# Patient Record
Sex: Male | Born: 1955 | Race: White | Hispanic: No | Marital: Married | Smoking: Never smoker
Health system: Southern US, Community
[De-identification: ages and names within clinical notes are randomized; demographics above are authoritative.]

## PROBLEM LIST (undated history)

## (undated) ENCOUNTER — Emergency Department (HOSPITAL_COMMUNITY): Admission: EM | Payer: BLUE CROSS/BLUE SHIELD | Source: Home / Self Care

## (undated) DIAGNOSIS — R51 Headache: Secondary | ICD-10-CM

## (undated) DIAGNOSIS — T07XXXA Unspecified multiple injuries, initial encounter: Secondary | ICD-10-CM

## (undated) DIAGNOSIS — J342 Deviated nasal septum: Secondary | ICD-10-CM

## (undated) DIAGNOSIS — Z86711 Personal history of pulmonary embolism: Secondary | ICD-10-CM

## (undated) DIAGNOSIS — H539 Unspecified visual disturbance: Secondary | ICD-10-CM

## (undated) DIAGNOSIS — E039 Hypothyroidism, unspecified: Secondary | ICD-10-CM

## (undated) DIAGNOSIS — J309 Allergic rhinitis, unspecified: Secondary | ICD-10-CM

## (undated) DIAGNOSIS — T7840XA Allergy, unspecified, initial encounter: Secondary | ICD-10-CM

## (undated) DIAGNOSIS — E079 Disorder of thyroid, unspecified: Secondary | ICD-10-CM

## (undated) DIAGNOSIS — I10 Essential (primary) hypertension: Secondary | ICD-10-CM

## (undated) DIAGNOSIS — I2699 Other pulmonary embolism without acute cor pulmonale: Secondary | ICD-10-CM

## (undated) DIAGNOSIS — D689 Coagulation defect, unspecified: Secondary | ICD-10-CM

## (undated) HISTORY — PX: ANKLE SURGERY: SHX546

## (undated) HISTORY — PX: THUMB ARTHROSCOPY: SHX2509

## (undated) HISTORY — DX: Allergy, unspecified, initial encounter: T78.40XA

## (undated) HISTORY — PX: KNEE ARTHROSCOPY WITH MENISCAL REPAIR: SHX5653

## (undated) HISTORY — DX: Coagulation defect, unspecified: D68.9

## (undated) HISTORY — DX: Disorder of thyroid, unspecified: E07.9

## (undated) HISTORY — DX: Allergic rhinitis, unspecified: J30.9

## (undated) HISTORY — PX: OTHER SURGICAL HISTORY: SHX169

## (undated) HISTORY — DX: Unspecified multiple injuries, initial encounter: T07.XXXA

## (undated) HISTORY — DX: Unspecified visual disturbance: H53.9

## (undated) HISTORY — PX: KNEE CARTILAGE SURGERY: SHX688

## (undated) HISTORY — DX: Deviated nasal septum: J34.2

## (undated) HISTORY — DX: Headache: R51

---

## 1974-05-30 DIAGNOSIS — I10 Essential (primary) hypertension: Secondary | ICD-10-CM

## 1974-05-30 HISTORY — DX: Essential (primary) hypertension: I10

## 1998-04-06 ENCOUNTER — Ambulatory Visit: Admit: 1998-04-06 | Disposition: A | Discharge status: Home / Self Care | Payer: Self-pay | Admitting: Internal Medicine

## 1999-03-09 ENCOUNTER — Ambulatory Visit: Admit: 1999-03-09 | Disposition: A | Discharge status: Home / Self Care | Payer: Self-pay | Admitting: Family Medicine

## 2003-05-31 HISTORY — PX: FRACTURE SURGERY: SHX138

## 2004-05-30 DIAGNOSIS — E039 Hypothyroidism, unspecified: Secondary | ICD-10-CM

## 2004-05-30 HISTORY — DX: Hypothyroidism, unspecified: E03.9

## 2004-05-30 HISTORY — PX: COLONOSCOPY: SHX174

## 2005-01-18 ENCOUNTER — Ambulatory Visit
Admission: AD | Admit: 2005-01-18 | Disposition: A | Discharge status: Home / Self Care | Payer: Self-pay | Source: Ambulatory Visit | Admitting: Hand Surgery

## 2006-05-30 DIAGNOSIS — K219 Gastro-esophageal reflux disease without esophagitis: Secondary | ICD-10-CM

## 2006-05-30 HISTORY — DX: Gastro-esophageal reflux disease without esophagitis: K21.9

## 2007-04-17 ENCOUNTER — Emergency Department
Admission: EM | Admit: 2007-04-17 | Disposition: A | Discharge status: Home / Self Care | Payer: Self-pay | Source: Emergency Department | Admitting: Emergency Medicine

## 2007-04-24 LAB — ^CBC WITH DIFF MCKESSON
BASOPHILS %: 0.7 % (ref 0–2)
Baso(Absolute): 0.1
Eosinophils %: 4.2 % (ref 0–6)
Eosinophils Absolute: 0.3
Hematocrit: 44.9 % (ref 39.0–49.0)
Hemoglobin: 15.1 g/dL (ref 13.2–17.3)
Lymphocytes Absolute: 1.6
Lymphocytes Relative: 22.4 % — ABNORMAL LOW (ref 25–55)
MCH: 32.2 pg (ref 27.0–34.0)
MCHC: 33.8 % (ref 32.0–36.0)
MCV: 95.3 fL (ref 80–100)
Monocytes Absolute: 0.7
Monocytes Relative %: 9.7 % — ABNORMAL HIGH (ref 1–8)
Neutrophils Absolute: 4.6
Neutrophils Relative %: 63 % (ref 49–69)
Platelets: 297 10*3/uL (ref 150–400)
RBC: 4.71 /mm3 (ref 3.80–5.40)
RDW: 12.1 % (ref 11.0–14.0)
WBC: 7.3 10*3/uL (ref 4.8–10.8)

## 2007-04-24 LAB — ^C-REACTIVE PROTEIN SCREEN MCKESSON: C-Reactive Protein Screen: NEGATIVE MG/L

## 2008-05-30 DIAGNOSIS — J189 Pneumonia, unspecified organism: Secondary | ICD-10-CM

## 2008-05-30 DIAGNOSIS — B9562 Methicillin resistant Staphylococcus aureus infection as the cause of diseases classified elsewhere: Secondary | ICD-10-CM

## 2008-05-30 HISTORY — DX: Pneumonia, unspecified organism: J18.9

## 2008-05-30 HISTORY — DX: Methicillin resistant Staphylococcus aureus infection as the cause of diseases classified elsewhere: B95.62

## 2011-01-29 DIAGNOSIS — I499 Cardiac arrhythmia, unspecified: Secondary | ICD-10-CM

## 2011-01-29 HISTORY — DX: Cardiac arrhythmia, unspecified: I49.9

## 2011-01-29 HISTORY — PX: OTHER SURGICAL HISTORY: SHX169

## 2011-04-30 DIAGNOSIS — I493 Ventricular premature depolarization: Secondary | ICD-10-CM

## 2011-04-30 HISTORY — DX: Ventricular premature depolarization: I49.3

## 2011-05-09 ENCOUNTER — Encounter (INDEPENDENT_AMBULATORY_CARE_PROVIDER_SITE_OTHER): Payer: Self-pay

## 2011-05-11 ENCOUNTER — Ambulatory Visit: Payer: Self-pay

## 2011-05-11 NOTE — Pre-Procedure Instructions (Signed)
Labs done 12/7 [facility];EKG LOV Dr Lujean Amel; Fax Dr Wish,05/11/11;call [facility] for [facility] 05/11/11

## 2011-05-12 NOTE — Pre-Procedure Instructions (Signed)
Reviewed labs

## 2011-05-12 NOTE — Pre-Procedure Instructions (Signed)
Labs,ekg,echo,stress,holter,h&p rev

## 2011-05-13 ENCOUNTER — Ambulatory Visit: Payer: Commercial Managed Care - PPO | Admitting: Clinical Cardiac Electrophysiology

## 2011-05-13 ENCOUNTER — Ambulatory Visit
Admission: RE | Admit: 2011-05-13 | Discharge: 2011-05-13 | Disposition: A | Discharge status: Home / Self Care | Payer: Commercial Managed Care - PPO | Source: Ambulatory Visit | Attending: Clinical Cardiac Electrophysiology | Admitting: Clinical Cardiac Electrophysiology

## 2011-05-13 ENCOUNTER — Encounter: Payer: Self-pay | Admitting: Anesthesiology

## 2011-05-13 ENCOUNTER — Ambulatory Visit: Payer: Commercial Managed Care - PPO | Admitting: Anesthesiology

## 2011-05-13 ENCOUNTER — Encounter
Admission: RE | Disposition: A | Discharge status: Home / Self Care | Payer: Self-pay | Source: Ambulatory Visit | Attending: Clinical Cardiac Electrophysiology

## 2011-05-13 DIAGNOSIS — I493 Ventricular premature depolarization: Secondary | ICD-10-CM

## 2011-05-13 DIAGNOSIS — I4949 Other premature depolarization: Secondary | ICD-10-CM | POA: Insufficient documentation

## 2011-05-13 DIAGNOSIS — Z880 Allergy status to penicillin: Secondary | ICD-10-CM | POA: Insufficient documentation

## 2011-05-13 SURGERY — ABLATION - RVOT
Anesthesia: Anesthesia MAC / Sedation

## 2011-05-13 MED ORDER — ASPIRIN 325 MG PO TBEC
325.00 mg | DELAYED_RELEASE_TABLET | Freq: Every day | ORAL | Status: DC
Start: 2011-05-13 — End: 2011-05-13
  Administered 2011-05-13: 325 mg via ORAL
  Filled 2011-05-13: qty 1

## 2011-05-13 MED ORDER — FAMOTIDINE 10 MG/ML IV SOLN
INTRAVENOUS | Status: DC | PRN
Start: 2011-05-13 — End: 2011-05-13
  Administered 2011-05-13: 20 mg via INTRAVENOUS

## 2011-05-13 MED ORDER — HEPARIN (PORCINE) IN NACL 2-0.9 UNIT/ML-% IJ SOLN
INTRAMUSCULAR | Status: AC
Start: 2011-05-13 — End: 2011-05-13
  Filled 2011-05-13: qty 2000

## 2011-05-13 MED ORDER — ASPIRIN EC 325 MG PO TBEC
325.00 mg | DELAYED_RELEASE_TABLET | Freq: Every day | ORAL | Status: AC
Start: 2011-05-13 — End: 2011-05-23

## 2011-05-13 MED ORDER — LIDOCAINE HCL (PF) 1 % IJ SOLN
INTRAMUSCULAR | Status: AC
Start: 2011-05-13 — End: 2011-05-13
  Administered 2011-05-13: 10 mL via SUBCUTANEOUS
  Filled 2011-05-13: qty 1

## 2011-05-13 MED ORDER — FENTANYL CITRATE 0.05 MG/ML IJ SOLN
INTRAMUSCULAR | Status: DC | PRN
Start: 2011-05-13 — End: 2011-05-13
  Administered 2011-05-13 (×2): 50 ug via INTRAVENOUS

## 2011-05-13 MED ORDER — PROPOFOL 10 MG/ML IV EMUL
INTRAVENOUS | Status: DC | PRN
Start: 2011-05-13 — End: 2011-05-13
  Administered 2011-05-13: 30 mg via INTRAVENOUS
  Administered 2011-05-13 (×2): 10 mg via INTRAVENOUS

## 2011-05-13 MED ORDER — MIDAZOLAM HCL 2 MG/2ML IJ SOLN
INTRAMUSCULAR | Status: DC | PRN
Start: 2011-05-13 — End: 2011-05-13
  Administered 2011-05-13 (×2): 1 mg via INTRAVENOUS

## 2011-05-13 MED ORDER — LACTATED RINGERS IV SOLN
INTRAVENOUS | Status: DC
Start: 2011-05-13 — End: 2011-05-13

## 2011-05-13 MED ORDER — LACTATED RINGERS IV SOLN
INTRAVENOUS | Status: DC | PRN
Start: 2011-05-13 — End: 2011-05-13

## 2011-05-13 NOTE — Progress Notes (Signed)
[place] via [facility] escorted per staff and family

## 2011-05-13 NOTE — Progress Notes (Signed)
Pt sitting up at time / groin site d/i

## 2011-05-13 NOTE — Discharge Instructions (Signed)
[facility]    Post Ablation Discharge Instructions    1.  It is normal to have bruising and tenderness at the site of the needle sticks in the groin area and the area below the collar bone.  A small lump may form in the groin or collar bone area which may take several weeks to disappear.    2.  It is NOT normal to have numbness or a cold feeling in your feet or down your legs.  Also, it is not normal to have a sharp, burning pain at the needle insertion sites.  If this occurs, please contact us as soon as possible.     3.  You may feel several "skipped" beats or a short burst of a fast heart rhythm for a few days after the ablation.  This is normal and should not alarm you.  If you experience rapid beating for more than 30 seconds, you should contact us as soon as possible.      4.  If you experience dizziness or lightheaded spells that are associated with palpitations please notify our office immediately.  It is normal to experience some dizziness upon arising for a few days after your procedure.    5.  Within 24-48 you should be able to return to normal activity unless otherwise instructed.  No heavy lifting or vigorous activity for one week.    6.  Please bring a current list of your medications at the time of your next office visit.    7.  You may be asked to take a daily aspirin for the next 4 weeks.  Please confirm with your discharge medication list.

## 2011-05-13 NOTE — Progress Notes (Signed)
Tolerated first ambulation well site d/i voided while up

## 2011-05-13 NOTE — Anesthesia Postprocedure Evaluation (Signed)
Anesthesia Post Evaluation    Patient: Reginald Walton    Procedures performed: Procedure(s) with comments:  ABLATION - RVOT - VT ABLATION    Anesthesia type: MAC    Patient location:Phase I PACU    Last vitals:   Filed Vitals:    05/13/11 0700   BP: 126/87   Pulse: 56   Temp: 97 F (36.1 C)   Resp: 20       Post pain: Patient not complaining of pain, continue current therapy     Mental Status:awake and alert     Respiratory Function: tolerating room air    Cardiovascular: stable    Nausea/Vomiting: patient not complaining of nausea or vomiting    Hydration Status: adequate    Post assessment: no apparent anesthetic complications

## 2011-05-13 NOTE — Anesthesia Preprocedure Evaluation (Signed)
Anesthesia Evaluation    AIRWAY    Mallampati: II    TM distance: >3 FB  Neck ROM: full  Mouth Opening:full   CARDIOVASCULAR    irregular     DENTAL    No notable dental hx     PULMONARY    pulmonary exam normal     OTHER FINDINGS          Anesthesia Plan    ASA 3   MAC   Detailed anesthesia plan: MAC and general IV  Monitors/Adjuncts: arterial line  Post Op: other  Post op pain management: PO analgesics  intravenous induction   informed consent obtained    Plan discussed with attending.        <IHSANLANDD>

## 2011-05-13 NOTE — Transfer of Care (Signed)
Anesthesia Transfer of Care Note    Patient: Reginald Walton    Procedures performed: Procedure(s) with comments:  ABLATION - RVOT - VT ABLATION    Anesthesia type: MAC    Patient location:[facility]    Last vitals:   B/p 127/79, hr 69, RR 12, sats 98 o/o at RA.  Post pain: Patient not complaining of pain, continue current therapy     Mental Status:awake    Respiratory Function: tolerating room air    Cardiovascular: stable    Nausea/Vomiting: patient not complaining of nausea or vomiting    Hydration Status: adequate    Post assessment: no apparent anesthetic complications

## 2011-06-04 ENCOUNTER — Emergency Department
Admission: EM | Admit: 2011-06-04 | Disposition: A | Discharge status: Home / Self Care | Payer: Self-pay | Source: Emergency Department | Admitting: Emergency Medical Services

## 2011-07-06 ENCOUNTER — Encounter (INDEPENDENT_AMBULATORY_CARE_PROVIDER_SITE_OTHER): Payer: Self-pay | Admitting: Family Medicine

## 2011-07-06 ENCOUNTER — Ambulatory Visit (INDEPENDENT_AMBULATORY_CARE_PROVIDER_SITE_OTHER): Payer: Commercial Managed Care - PPO | Admitting: Family Medicine

## 2011-07-06 ENCOUNTER — Other Ambulatory Visit (INDEPENDENT_AMBULATORY_CARE_PROVIDER_SITE_OTHER): Payer: Self-pay | Admitting: Family Medicine

## 2011-07-06 VITALS — BP 138/86 | HR 75 | Temp 97.3°F | Resp 16 | Ht 67.0 in | Wt 180.6 lb

## 2011-07-06 DIAGNOSIS — J329 Chronic sinusitis, unspecified: Secondary | ICD-10-CM | POA: Insufficient documentation

## 2011-07-06 DIAGNOSIS — J3489 Other specified disorders of nose and nasal sinuses: Secondary | ICD-10-CM

## 2011-07-06 DIAGNOSIS — R0981 Nasal congestion: Secondary | ICD-10-CM

## 2011-07-06 DIAGNOSIS — J342 Deviated nasal septum: Secondary | ICD-10-CM

## 2011-07-06 MED ORDER — MONTELUKAST SODIUM 10 MG PO TABS
10.00 mg | ORAL_TABLET | Freq: Every day | ORAL | Status: DC
Start: 2011-07-06 — End: 2011-07-06

## 2011-07-06 MED ORDER — FLUTICASONE PROPIONATE 50 MCG/ACT NA SUSP
1.00 | Freq: Every day | NASAL | Status: DC
Start: 2011-07-06 — End: 2011-07-06

## 2011-07-06 MED ORDER — FLUTICASONE PROPIONATE 50 MCG/ACT NA SUSP
1.00 | Freq: Every day | NASAL | Status: AC
Start: 2011-07-06 — End: 2012-07-05

## 2011-07-06 MED ORDER — MONTELUKAST SODIUM 10 MG PO TABS
10.00 mg | ORAL_TABLET | Freq: Every day | ORAL | Status: AC
Start: 2011-07-06 — End: 2012-07-05

## 2011-07-06 NOTE — Telephone Encounter (Signed)
Message copied by Caryl Never on Wed Jul 06, 2011  4:00 PM  ------       Message from: Alois Cliche       Created: Wed Jul 06, 2011  3:54 PM         Patient called with his new pharmacy number [phone]. If you can call his prescription to his pharmacy

## 2011-07-06 NOTE — Progress Notes (Signed)
Subjective:       Patient ID: Reginald Walton is a 56 y.o. male.    Chief Complaint   Patient presents with   . Establish Care     Former pt of Dr. Devoria Glassing    . Sinusitis       Sinusitis  This is a recurrent problem. The current episode started more than 1 year ago. The problem has been waxing and waning since onset. There has been no fever. Associated symptoms include congestion, coughing, headaches and sinus pressure. Pertinent negatives include no chills, ear pain, hoarse voice, neck pain, shortness of breath, sore throat or swollen glands. Past treatments include oral decongestants and saline sprays (Claritin-D, sinus rinse). The treatment provided moderate relief.       Hx of deviated nasal septum.  Saw ENT in the past in 2010.  Pressure/pain on right side, but was told imaging shows left sided abnormalities.  Was recommended to get surgery.  Cough improved lately.       The following portions of the patient's history were reviewed and updated as appropriate: allergies, current medications, past family history, past medical history, past social history, past surgical history and problem list.    Review of Systems   Constitutional: Negative for fever and chills.   HENT: Positive for congestion, postnasal drip and sinus pressure. Negative for ear pain, sore throat, hoarse voice, rhinorrhea and neck pain.    Respiratory: Positive for cough. Negative for shortness of breath.    Neurological: Positive for headaches. Negative for light-headedness.   Hematological: Negative for adenopathy.   Psychiatric/Behavioral: Negative for disturbed wake/sleep cycle.           Objective:    Physical Exam   Vitals reviewed.  Constitutional: He appears well-developed and well-nourished. He does not have a sickly appearance. He does not appear ill.   HENT:   Right Ear: External ear and ear canal normal. Tympanic membrane is not bulging. No middle ear effusion.   Left Ear: Tympanic membrane, external ear and ear canal normal. Tympanic  membrane is not bulging.  No middle ear effusion.   Nose: Mucosal edema and septal deviation present. No rhinorrhea. Right sinus exhibits no maxillary sinus tenderness and no frontal sinus tenderness. Left sinus exhibits no maxillary sinus tenderness and no frontal sinus tenderness.   Mouth/Throat: Uvula is midline, oropharynx is clear and moist and mucous membranes are normal. No oropharyngeal exudate, posterior oropharyngeal edema or posterior oropharyngeal erythema.   Neck: Trachea normal and normal range of motion. Neck supple.   Cardiovascular: Normal rate, regular rhythm and normal heart sounds.    No murmur heard.  Pulmonary/Chest: Effort normal and breath sounds normal. No respiratory distress.   Lymphadenopathy:     He has no cervical adenopathy.   Psychiatric: He has a normal mood and affect.     Filed Vitals:    07/06/11 1343 07/06/11 1420   BP: 144/88 138/86   Pulse: 75    Temp: 97.3 F (36.3 C)    TempSrc: Oral    Resp: 16    Height: 1.702 m (5\' 7" )    Weight: 81.92 kg (180 lb 9.6 oz)            Assessment:       1. Recurrent sinusitis  montelukast (SINGULAIR) 10 MG tablet, fluticasone (FLONASE) 50 MCG/ACT nasal spray, Ambulatory referral to ENT   2. Deviated nasal septum  montelukast (SINGULAIR) 10 MG tablet, fluticasone (FLONASE) 50 MCG/ACT nasal spray, Ambulatory referral to  ENT   3. Nasal congestion  montelukast (SINGULAIR) 10 MG tablet, fluticasone (FLONASE) 50 MCG/ACT nasal spray, Ambulatory referral to ENT           Plan:       Avoid decongestants due to HTN.  Try Singulair and Flonase nasal spray.   Refer to ENT.    Discussed s/sx's to watch out for that would warrant immediate re-evaluation.  FU if symptoms worsen/persist.   Declines flu vaccine.     Darlene Bartelt T. Matilde Haymaker, MD

## 2011-08-03 ENCOUNTER — Telehealth (INDEPENDENT_AMBULATORY_CARE_PROVIDER_SITE_OTHER): Payer: Self-pay

## 2011-08-03 ENCOUNTER — Encounter (INDEPENDENT_AMBULATORY_CARE_PROVIDER_SITE_OTHER): Payer: Self-pay | Admitting: Family Medicine

## 2011-08-03 DIAGNOSIS — R7989 Other specified abnormal findings of blood chemistry: Secondary | ICD-10-CM | POA: Insufficient documentation

## 2011-08-03 NOTE — Telephone Encounter (Signed)
Message copied by Caryl Never on Wed Aug 03, 2011 11:29 AM  ------       Message from: Lambert Keto PATRICI       Created: Wed Aug 03, 2011 10:24 AM         Please inform pt that he had labs done by the cardiologist in 04/2011 and it seems that his thyroid test was high.  Was wondering if the cardiologist had recommended anything for him like rpt testing, etc.  If not, please have him come in for rpt TSH test (lab draw is fine, if pt has not other issues).

## 2011-08-03 NOTE — Telephone Encounter (Signed)
Detailed message left in VM w/call back #. PPN verified

## 2011-08-04 ENCOUNTER — Encounter (INDEPENDENT_AMBULATORY_CARE_PROVIDER_SITE_OTHER): Payer: Self-pay

## 2011-08-07 ENCOUNTER — Inpatient Hospital Stay
Admission: EM | Admit: 2011-08-07 | Disposition: A | Discharge status: Home / Self Care | Payer: Self-pay | Source: Emergency Department | Admitting: Internal Medicine

## 2011-08-07 LAB — CBC AND DIFFERENTIAL
BASOPHILS %: 0.6 %
Baso(Absolute): 0.06 10*3/uL (ref 0.00–0.20)
Eosinophils %: 5 %
Eosinophils Absolute: 0.51 10*3/uL — ABNORMAL HIGH (ref 0.10–0.30)
Hematocrit: 35.9 % — ABNORMAL LOW (ref 39.0–49.0)
Hemoglobin: 12.3 g/dL — ABNORMAL LOW (ref 13.2–17.3)
Immature Granulocytes #: 0.02 10*3/uL (ref 0.00–0.05)
Immature Granulocytes %: 0.2 %
Lymphocytes Absolute: 1.58 10*3/uL (ref 1.00–4.80)
Lymphocytes Relative: 15.4 %
MCH: 30.8 pg (ref 27.0–34.0)
MCHC: 34.3 g/dL (ref 32.0–36.0)
MCV: 89.8 fL (ref 80–100)
MPV: 9.6 fL (ref 9.0–13.0)
Monocytes Absolute: 1.03 10*3/uL (ref 0.10–1.20)
Monocytes Relative %: 10.1 %
Neutrophils Absolute: 7.06 10*3/uL (ref 1.80–7.70)
Neutrophils Relative %: 68.9 %
Nucleated RBC %: 0 /100WBC
Nucleted RBC #: 0 10*3/uL (ref 0.00–0.00)
Platelets: 263 10*3/uL (ref 150–400)
RBC: 4 M/uL (ref 3.80–5.40)
RDW: 12.1 % (ref 11.0–14.0)
WBC: 10.24 10*3/uL (ref 4.80–10.80)

## 2011-08-07 LAB — COMPREHENSIVE METABOLIC PANEL
ALT: 32 U/L (ref 7–56)
AST (SGOT): 20 U/L (ref 5–40)
Albumin: 3.5 g/dL — ABNORMAL LOW (ref 3.9–5.0)
Alkaline Phosphatase: 68 U/L (ref 38–126)
BUN / Creatinine Ratio: 29 — ABNORMAL HIGH (ref 8–20)
BUN: 30 mg/dL — ABNORMAL HIGH (ref 6–20)
Bilirubin, Total: 0.3 mg/dL (ref 0.2–1.3)
CO2: 25 mmol/L (ref 21.0–31.0)
Calcium: 9.4 mg/dL (ref 8.4–10.2)
Chloride: 105 mmol/L (ref 101–111)
Creatinine: 1.04 mg/dL (ref 0.66–1.25)
EGFR: >60 mL/min/{1.73_m2}
EGFR: >60 mL/min/{1.73_m2}
Glucose: 109 mg/dL — ABNORMAL HIGH (ref 70–100)
Potassium: 4.2 mmol/L (ref 3.6–5.0)
Protein, Total: 6.8 g/dL (ref 6.3–8.2)
Sodium: 142 mmol/L (ref 135–145)

## 2011-08-07 LAB — D-DIMER, QUANTITATIVE: D-Dimer, Quant.: 3.16 ug/mL-FEU — ABNORMAL HIGH (ref 0.00–0.51)

## 2011-08-07 LAB — MAGNESIUM: Magnesium: 1.9 mg/dL (ref 1.7–2.2)

## 2011-08-07 LAB — MYOGLOBIN, SERUM: Myoglobin: 24.4 ng/mL (ref 0.0–121.0)

## 2011-08-07 LAB — CK: Creatine Kinase (CK): 87 U/L (ref 19–216)

## 2011-08-07 LAB — TROPONIN I: Troponin I: <0.012 ng/mL (ref 0.000–0.034)

## 2011-08-08 LAB — COMPREHENSIVE METABOLIC PANEL
ALT: 24 U/L (ref 7–56)
AST (SGOT): 19 U/L (ref 5–40)
Albumin: 2.9 g/dL — ABNORMAL LOW (ref 3.9–5.0)
Alkaline Phosphatase: 61 U/L (ref 38–126)
BUN / Creatinine Ratio: 31 — ABNORMAL HIGH (ref 8–20)
BUN: 27 mg/dL — ABNORMAL HIGH (ref 6–20)
Bilirubin, Total: 0.4 mg/dL (ref 0.2–1.3)
CO2: 20 mmol/L — ABNORMAL LOW (ref 21.0–31.0)
Calcium: 8.7 mg/dL (ref 8.4–10.2)
Chloride: 108 mmol/L (ref 101–111)
Creatinine: 0.86 mg/dL (ref 0.66–1.25)
EGFR: >60 mL/min/{1.73_m2}
EGFR: >60 mL/min/{1.73_m2}
Glucose: 105 mg/dL — ABNORMAL HIGH (ref 70–100)
Potassium: 4.6 mmol/L (ref 3.6–5.0)
Protein, Total: 5.8 g/dL — ABNORMAL LOW (ref 6.3–8.2)
Sodium: 138 mmol/L (ref 135–145)

## 2011-08-08 LAB — URINALYSIS
Bilirubin, UA: NEGATIVE
Glucose, UA: NEGATIVE
Ketones UA: NEGATIVE
Leukocyte Esterase, UA: NEGATIVE
Nitrate: NEGATIVE
Protein, UR: NEGATIVE
Specific Gravity, UR: >1.035 — ABNORMAL HIGH (ref 1.000–1.035)
Urobilinogen, UA: NORMAL
pH, Urine: 5.5 (ref 5.0–8.0)

## 2011-08-08 LAB — CBC
Hematocrit: 33.1 % — ABNORMAL LOW (ref 39.0–49.0)
Hemoglobin: 11.2 g/dL — ABNORMAL LOW (ref 13.2–17.3)
MCH: 30.5 pg (ref 27.0–34.0)
MCHC: 33.8 g/dL (ref 32.0–36.0)
MCV: 90.2 fL (ref 80–100)
MPV: 9.6 fL (ref 9.0–13.0)
Nucleated RBC %: 0 /100WBC
Nucleted RBC #: 0 10*3/uL (ref 0.00–0.00)
Platelets: 213 10*3/uL (ref 150–400)
RBC: 3.67 M/uL — ABNORMAL LOW (ref 3.80–5.40)
RDW: 12.1 % (ref 11.0–14.0)
WBC: 10.86 10*3/uL — ABNORMAL HIGH (ref 4.80–10.80)

## 2011-08-08 LAB — CK
Creatine Kinase (CK): 69 U/L (ref 19–216)
Creatine Kinase (CK): 64 U/L (ref 19–216)

## 2011-08-08 LAB — URINALYSIS WITH MICROSCOPIC

## 2011-08-08 LAB — PT (PROTHROMBIN TIME)
PT INR: 1.1
PT: 14 s (ref 12.6–15.0)

## 2011-08-08 LAB — PTT (PARTIAL THROMBOPLASTIN TIME): PTT: 33.4 s (ref 23.0–37.0)

## 2011-08-08 LAB — TROPONIN I: Troponin I: <0.012 ng/mL (ref 0.000–0.034)

## 2011-08-08 LAB — MAGNESIUM: Magnesium: 1.9 mg/dL (ref 1.7–2.2)

## 2011-08-09 LAB — CBC AND DIFFERENTIAL
BASOPHILS %: 0.3 %
Baso(Absolute): 0.04 10*3/uL (ref 0.00–0.20)
Eosinophils %: 0.3 %
Eosinophils Absolute: 0.04 10*3/uL — ABNORMAL LOW (ref 0.10–0.30)
Hematocrit: 32.8 % — ABNORMAL LOW (ref 39.0–49.0)
Hemoglobin: 11.1 g/dL — ABNORMAL LOW (ref 13.2–17.3)
Immature Granulocytes #: 0.05 10*3/uL (ref 0.00–0.05)
Immature Granulocytes %: 0.4 %
Lymphocytes Absolute: 1.04 10*3/uL (ref 1.00–4.80)
Lymphocytes Relative: 7.6 %
MCH: 30.6 pg (ref 27.0–34.0)
MCHC: 33.8 g/dL (ref 32.0–36.0)
MCV: 90.4 fL (ref 80–100)
MPV: 9.6 fL (ref 9.0–13.0)
Monocytes Absolute: 1.18 10*3/uL (ref 0.10–1.20)
Monocytes Relative %: 8.6 %
Neutrophils Absolute: 11.36 10*3/uL — ABNORMAL HIGH (ref 1.80–7.70)
Neutrophils Relative %: 83.2 %
Nucleated RBC %: 0 /100WBC
Nucleted RBC #: 0 10*3/uL (ref 0.00–0.00)
Platelets: 222 10*3/uL (ref 150–400)
RBC: 3.63 M/uL — ABNORMAL LOW (ref 3.80–5.40)
RDW: 12 % (ref 11.0–14.0)
WBC: 13.66 10*3/uL — ABNORMAL HIGH (ref 4.80–10.80)

## 2011-08-09 LAB — PT (PROTHROMBIN TIME)
PT INR: 1.2
PT: 15.1 s — ABNORMAL HIGH (ref 12.6–15.0)

## 2011-08-09 LAB — BASIC METABOLIC PANEL
BUN / Creatinine Ratio: 21 — ABNORMAL HIGH (ref 8–20)
BUN: 18 mg/dL (ref 6–20)
CO2: 23 mmol/L (ref 21.0–31.0)
Calcium: 8.6 mg/dL (ref 8.4–10.2)
Chloride: 104 mmol/L (ref 101–111)
Creatinine: 0.89 mg/dL (ref 0.66–1.25)
EGFR: >60 mL/min/{1.73_m2}
EGFR: >60 mL/min/{1.73_m2}
Glucose: 121 mg/dL — ABNORMAL HIGH (ref 70–100)
Potassium: 4.5 mmol/L (ref 3.6–5.0)
Sodium: 135 mmol/L (ref 135–145)

## 2011-08-09 LAB — PTT (PARTIAL THROMBOPLASTIN TIME): PTT: 37.4 s — ABNORMAL HIGH (ref 23.0–37.0)

## 2011-08-09 LAB — MAGNESIUM: Magnesium: 1.9 mg/dL (ref 1.7–2.2)

## 2011-08-09 LAB — MORPH REVIEW
Cell Morphology:: NORMAL
Platelet Evaluation: NORMAL

## 2011-08-09 LAB — PHOSPHORUS: Phosphorus: 3.1 mg/dL (ref 2.4–4.4)

## 2011-08-10 LAB — CBC AND DIFFERENTIAL
BASOPHILS %: 0 %
Baso(Absolute): 0 10*3/uL (ref 0.00–0.20)
Eosinophils %: 0 %
Eosinophils Absolute: 0 10*3/uL — ABNORMAL LOW (ref 0.10–0.30)
Hematocrit: 31.5 % — ABNORMAL LOW (ref 39.0–49.0)
Hemoglobin: 10.9 g/dL — ABNORMAL LOW (ref 13.2–17.3)
Immature Granulocytes #: 0.07 10*3/uL — ABNORMAL HIGH (ref 0.00–0.05)
Immature Granulocytes %: 0.4 %
Lymphocytes Absolute: 0.77 10*3/uL — ABNORMAL LOW (ref 1.00–4.80)
Lymphocytes Relative: 4.2 %
MCH: 30.4 pg (ref 27.0–34.0)
MCHC: 34.6 g/dL (ref 32.0–36.0)
MCV: 88 fL (ref 80–100)
MPV: 9.9 fL (ref 9.0–13.0)
Monocytes Absolute: 0.99 10*3/uL (ref 0.10–1.20)
Monocytes Relative %: 5.4 %
Neutrophils Absolute: 16.63 10*3/uL — ABNORMAL HIGH (ref 1.80–7.70)
Neutrophils Relative %: 90.4 %
Nucleated RBC %: 0 /100WBC
Nucleted RBC #: 0 10*3/uL (ref 0.00–0.00)
Platelets: 255 10*3/uL (ref 150–400)
RBC: 3.58 M/uL — ABNORMAL LOW (ref 3.80–5.40)
RDW: 11.7 % (ref 11.0–14.0)
WBC: 18.39 10*3/uL — ABNORMAL HIGH (ref 4.80–10.80)

## 2011-08-10 LAB — BASIC METABOLIC PANEL
BUN / Creatinine Ratio: 26 — ABNORMAL HIGH (ref 8–20)
BUN: 25 mg/dL — ABNORMAL HIGH (ref 6–20)
CO2: 24 mmol/L (ref 21.0–31.0)
Calcium: 9.2 mg/dL (ref 8.4–10.2)
Chloride: 105 mmol/L (ref 101–111)
Creatinine: 0.98 mg/dL (ref 0.66–1.25)
EGFR: >60 mL/min/{1.73_m2}
EGFR: >60 mL/min/{1.73_m2}
Glucose: 129 mg/dL — ABNORMAL HIGH (ref 70–100)
Potassium: 4.7 mmol/L (ref 3.6–5.0)
Sodium: 138 mmol/L (ref 135–145)

## 2011-08-10 LAB — MAGNESIUM: Magnesium: 2.1 mg/dL (ref 1.7–2.2)

## 2011-08-10 LAB — MORPH REVIEW
Cell Morphology:: NORMAL
Platelet Evaluation: NORMAL

## 2011-08-10 LAB — PT (PROTHROMBIN TIME)
PT INR: 1.3
PT: 16 s — ABNORMAL HIGH (ref 12.6–15.0)

## 2011-08-10 LAB — PHOSPHORUS: Phosphorus: 3.1 mg/dL (ref 2.4–4.4)

## 2011-08-10 LAB — PTT (PARTIAL THROMBOPLASTIN TIME): PTT: 39.2 s — ABNORMAL HIGH (ref 23.0–37.0)

## 2011-08-11 LAB — PROSTATE SPECIFIC ANTIGEN: Prostate Specific Antigen: 0.4 ng/mL (ref 0.0–4.0)

## 2011-08-11 LAB — LUPUS ANTICOAGULANT
DiluteRussell Viper Venom, PAT: 42 s (ref 33–44)
PT: 16.8 s — ABNORMAL HIGH (ref 12.0–15.5)
PTT-D Corr Reflex: 46 s (ref 32–48)
Partial Thromboplastin: 52 s — ABNORMAL HIGH (ref 32–48)
Thrombin Time: 17.4 s (ref 14.7–19.5)

## 2011-08-11 LAB — CARCINOEMBRYONIC ANTIGEN: Carcinoembryonic Ag: 1.5 ng/mL (ref 0.0–3.0)

## 2011-08-11 LAB — CARDIOLIPIN ANTIBODY, IGA: Cardiolipin AB, IgA: 2 [APL'U] (ref 0–11)

## 2011-08-11 NOTE — Discharge Summary (Signed)
Reginald Walton, POLLACK                                                    MRN:          [id number]                                                          Account:      [id number]                                                     Document ID:  [id number]                                                                                                                                    MRN: [id number]  Document ID: [id number]  Admit Date: 08/07/2011  Discharge Date: 08/10/2011     ATTENDING PHYSICIAN:  Asra Gambrel, MD        DISCHARGE DIAGNOSES:  1.  Bilateral acute pulmonary embolism.  2.  Hypertension.  3.  Hypothyroidism.  4.  History of supraventricular tachycardia status post ablation.     CONSULTS:  Interventional radiology consult with Dr. George Hugh.  Hematology-oncology  consult with Dr. Frankey Shown.     PROCEDURES DONE IN HOSPITAL:  None.     HOSPITAL COURSE:  The patient is a 56 year old Caucasian male with a past medical history  significant for hyperthyroidism, SVT status post ablation and hypertension,  presented to the ER with complaints of left-sided chest pain.  The patient  reported a history of driving approximately 5 hours last weekend to  [place], [place] and back again for 5 hours, making a total of 10  hours drive.  A CT scan of the chest with IV contrast was done in the ER,  which was positive for bilateral acute pulmonary embolism, no evidence of  right cardiac strain or evidence of pulmonary infarction; however,  possibility of early pulmonary infarction in the left base was not entirely  excluded.  The patient was started on therapeutic dose of Lovenox along  with Coumadin.  The patient was kept on oxygen via nasal cannula to  maintain oxygen saturation above 92%.  Duplex of the lower extremities was  ordered, which came back negative for DVTs.  A 2-dimensional echocardiogram  was ordered to evaluate for right heart strain.  Normal left ventricular  ejection fraction at 65%.  The patient was  complaining of ongoing chest pain and breathing difficulty  with the slightest movement; however, he remained hemodynamically stable.   A consult with interventional radiology was called to evaluate for possible  thrombolysis; however, with the patient being hemodynamically stable, there  appeared no role for thrombolysis or thrombectomy at that point.   Suggestion was to continue anticoagulation therapy.  The IVC filter was not  recommended, as venous duplex was negative for any lower extremity DVT.                                                                                                            Page 1 of 2  WILLAIM, MODE                                                    MRN:          [id number]                                                          Account:      [id number]                                                     Document ID:  [id number]                                                                                                                                    Hematology consult was called secondary to strong family history of  multiple members being on Coumadin, including father and grandparent.  A  hypercoagulability workup has been done, results at this point, which are  pending.  A Medrol pack was added for ongoing chest pain with significant  improvement in the patient's symptoms of shortness of breath and chest  pain.  He has been taken off his nasal cannula and is saturating well both  on room air and on ambulation.  The patient has been given  Lovenox and  Coumadin education.  He is to follow up with Dr. Noralyn Pick in 2 weeks for an  INR check.  Currently, his INR is 1.3 on 5 mg of Coumadin.     DISCHARGE MEDICATIONS:  Please see medicine reconciliation form.  New medications will include  Lovenox 80 mg subcutaneously every 12 hours, Coumadin 5 mg 1 tablet p.o.  daily, and Medrol Pak taper.     DISCHARGE DISPOSITION:  The patient is being discharged home.     DISCHARGE  CONDITION:  Stable.           ELECTRONIC SIGNING PROVIDER  _______________________________     Date/Time Signed: _____________  Simmie Davies Harland Aguiniga MD ([id number])     D:  08/10/2011 16:36 PM by Sheila Oats, MD ([id number])  T:  08/11/2011 13:16 PM by [id number]      Everlean Cherry: [id number]) (Doc ID: [id number])           cc:                                                                                                            Page 2 of 2  Authenticated by Simmie Davies Coltyn Hanning, MD On 08/11/2011 03:58:16 PM

## 2011-08-12 LAB — PROTEIN C&S PANEL, TOTAL
Protein C, Total Ag: 52 % — ABNORMAL LOW (ref 63–153)
Protein S, Total, Ag: 131 % (ref 84–134)

## 2011-08-13 LAB — METHYL. REDUCTASE MUTATION DETECTION
MTHFR Mutation A1298C: NEGATIVE
MTHFR Mutation C677T: NEGATIVE

## 2011-08-14 LAB — PROTHROMBIN NUCLEOTIDE 20210 G/A GENE MUTATION: Prothrombin by PCR and FRET: NEGATIVE

## 2011-08-16 LAB — FACTOR V LEIDEN: Factor V Leiden by PCR/FRET: NEGATIVE

## 2011-08-18 NOTE — Consults (Signed)
DICK, HARK                                                   MRN:          [id number]                                                         Account:      [id number]                                                    Document ID:  [id number]                                              Service Date: 03/12/201                                                                                   MRN: [id number]  Document ID: [id number]  Admit Date: 03/10/201     Patient Location: [id number]  Patient Type:      CONSULTING PHYSICIAN: Kavontae Pritchard M     REFERRING PHYSICIAN:        REFERRING PHYSICIAN  Dr Donavan Foil     REASON FOR REFERRAL  Pulmonary embolism     HISTORY OF PRESENT ILLNESS  The patient is a 56 year old gentleman who has past medical history o  supraventricular tachycardia status post ablation, hypertension, presente  to the hospital with complaints of left-sided chest pain, which go  progressively worse.  He had CT Pulmonary angiogram in the emergency room  which showed bilateral acute pulmonary embolism with the possibility fo  early pulmonary infarction in the left base.  The patient has no prio  history of pulmonary embolism or DVT.  He reportedly drives about 5 hour  to work to [facility] and then drives back for a total of 1  hours of driving every week.  He had Dopplers done of bilateral lowe  extremity, which were negative for any evidence of DVT     FAMILY HISTORY  Unremarkable for deep venous thrombosis.  He has had prior surgica  procedures involving a couple of days of rest, but not associated wit  thrombosis.  Hematology consultation sought for further workup an  recommendations     PAST MEDICAL HISTORY  Significant for SVT status post ablation done in December of 2012  hypothyroidism, hypertension.  The patient is up to date with hi  colonoscopy which was done 6 years ago and PSA has been done every year b  his general physician  MEDICATIONS AT HOME  Include lisinopril,  hydrochlorothiazide, and Levoxyl                                                                                                             Page 1 of   LUIS, NICKLES                                                   MRN:          [id number]                                                         Account:      [id number]                                                    Document ID:  [id number]                                              Service Date: 03/12/201                                                                                   ALLERGIES  PENICILLIN     SOCIAL HISTORY  The patient is married, lives with family at home.  Does not drink, smoke  or use illicit drugs     FAMILY HISTORY  Remarkable for DVT or PE     REVIEW OF SYSTEMS  As per HPI     PHYSICAL EXAMINATION  GENERAL:  The patient is alert, awake, oriented x3, comfortable at rest  VITAL SIGNS:  Temperature 97, pulse 76, respirations 17, blood pressur  133/68  HEENT:  Negative for scleral icterus or conjunctival pallor.  No intraora  lesions  NECK:  Supple, no jugular, no lymphadenopathy  CARDIOVASCULAR:  S1, S2 regular  CHEST:  Auscultation of the chest reveals good bilateral air entry  ABDOMEN:  Soft, nontender, benign, no hepatosplenomegaly  EXTREMITIES:  Negative for cyanosis, clubbing or edema     LABORATORY AND DIAGNOSTIC DATA:  Reveals WBC 13.6, hemoglobin 11.1, hematocrit 32.8, MCV of 90, platelets o  222 with 83% neutrophils, 7% lymphocytes.  Sodium 135, potassium 4.5  chloride is 104, bicarbonate 23, glucose 121, BUN 18, and creatinine o  0.89.  CT of the chest as mentioned above, significant for bilateral P  with possibility of early infarction in the left base     ASSESSMENT AND RECOMMENDATIONS  The patient is a very pleasant 56 year old gentleman with bilateral PE an  Dopplers negative for evidence of DVT with no prior history of thrombosi  or family history suggestive of familial thrombophilia disorder.  However  given his age  and his lifestyle, I would recommend doing an entir  hypercoagulable workup to rule out underlying risk factors fo  thrombophilia.  We will check for antiphospholipid antibody syndrome, whic  can cause arterial and venous thrombosis.  We will also check for factor   Leiden and prothrombin gene mutation, protein C, protein S, antithrombi  III.  Continue Lovenox and Coumadin.  Further recommendations regardin  duration of anticoagulation would depend upon results of th  hypercoagulable workup.  All this was discussed at length with the patien  and his significant other.  Interaction of Coumadin and Lovenox includin                                                                                                          Page 2 of   KINO, DUNSWORTH                                                   MRN:          [id number]                                                         Account:      [id number]                                                    Document ID:  [id number]                                              Service Date: 03/12/201  the side effects were discussed     I would also recommend checking CEA levels at this point; last colonoscop  was 6 years ago.  He will come back for a screening colonoscopy once he i  off anticoagulation.  We will also check PSA levels.  Furthe  recommendations pending clinical course.  We will follow him with yo  closely     Thank you, Dr. _____for involving me in the care of this lovely patient              D:  08/09/2011 17:48 PM by Jeanie Sewer, MD [phone]  T:  08/10/2011 07:12 AM by [id number]      Everlean Cherry: [id number]) (Doc ID: [id number]        cc:                                                                                                              Page 3 of   Authenticated and Edited by Jeanie Sewer, MD On 08/18/11 9:53:23 AM

## 2012-02-25 LAB — ECG 12-LEAD
Atrial Rate: 85 {beats}/min
P Axis: 37 degrees
P-R Interval: 126 ms
Q-T Interval: 374 ms
QRS Duration: 76 ms
QTC Calculation (Bezet): 445 ms
R Axis: 21 degrees
T Axis: 22 degrees
Ventricular Rate: 85 {beats}/min

## 2014-08-14 ENCOUNTER — Emergency Department (HOSPITAL_BASED_OUTPATIENT_CLINIC_OR_DEPARTMENT_OTHER): Payer: BLUE CROSS/BLUE SHIELD

## 2014-08-14 ENCOUNTER — Encounter (HOSPITAL_BASED_OUTPATIENT_CLINIC_OR_DEPARTMENT_OTHER): Payer: Self-pay | Admitting: *Deleted

## 2014-08-14 ENCOUNTER — Inpatient Hospital Stay (HOSPITAL_BASED_OUTPATIENT_CLINIC_OR_DEPARTMENT_OTHER)
Admission: EM | Admit: 2014-08-14 | Discharge: 2014-08-16 | DRG: 176 | Disposition: A | Discharge status: Home / Self Care | Payer: BLUE CROSS/BLUE SHIELD | Attending: Internal Medicine | Admitting: Internal Medicine

## 2014-08-14 ENCOUNTER — Emergency Department
Admission: EM | Admit: 2014-08-14 | Discharge: 2014-08-14 | Disposition: A | Discharge status: Home / Self Care | Payer: BLUE CROSS/BLUE SHIELD | Source: Home / Self Care | Attending: Emergency Medicine | Admitting: Emergency Medicine

## 2014-08-14 ENCOUNTER — Encounter: Payer: Self-pay | Admitting: Emergency Medicine

## 2014-08-14 DIAGNOSIS — E039 Hypothyroidism, unspecified: Secondary | ICD-10-CM | POA: Diagnosis present

## 2014-08-14 DIAGNOSIS — I82401 Acute embolism and thrombosis of unspecified deep veins of right lower extremity: Secondary | ICD-10-CM | POA: Diagnosis present

## 2014-08-14 DIAGNOSIS — Z7901 Long term (current) use of anticoagulants: Secondary | ICD-10-CM

## 2014-08-14 DIAGNOSIS — I82409 Acute embolism and thrombosis of unspecified deep veins of unspecified lower extremity: Secondary | ICD-10-CM

## 2014-08-14 DIAGNOSIS — M79604 Pain in right leg: Secondary | ICD-10-CM

## 2014-08-14 DIAGNOSIS — R0602 Shortness of breath: Secondary | ICD-10-CM

## 2014-08-14 DIAGNOSIS — M79661 Pain in right lower leg: Secondary | ICD-10-CM

## 2014-08-14 DIAGNOSIS — I10 Essential (primary) hypertension: Secondary | ICD-10-CM | POA: Diagnosis present

## 2014-08-14 DIAGNOSIS — Z880 Allergy status to penicillin: Secondary | ICD-10-CM | POA: Diagnosis not present

## 2014-08-14 DIAGNOSIS — J302 Other seasonal allergic rhinitis: Secondary | ICD-10-CM | POA: Diagnosis present

## 2014-08-14 DIAGNOSIS — I2699 Other pulmonary embolism without acute cor pulmonale: Principal | ICD-10-CM | POA: Diagnosis present

## 2014-08-14 HISTORY — DX: Hypothyroidism, unspecified: E03.9

## 2014-08-14 HISTORY — DX: Other pulmonary embolism without acute cor pulmonale: I26.99

## 2014-08-14 HISTORY — DX: Essential (primary) hypertension: I10

## 2014-08-14 LAB — COMPREHENSIVE METABOLIC PANEL
ALK PHOS: 66 U/L (ref 39–117)
ALT: 22 U/L (ref 0–53)
AST: 22 U/L (ref 0–37)
Albumin: 3.7 g/dL (ref 3.5–5.2)
Anion gap: 10 (ref 5–15)
BUN: 24 mg/dL — AB (ref 6–23)
CHLORIDE: 107 mmol/L (ref 96–112)
CO2: 24 mmol/L (ref 19–32)
Calcium: 9.4 mg/dL (ref 8.4–10.5)
Creatinine, Ser: 1.21 mg/dL (ref 0.50–1.35)
GFR calc non Af Amer: 64 mL/min — ABNORMAL LOW (ref >90)
GFR, EST AFRICAN AMERICAN: 74 mL/min — AB (ref >90)
GLUCOSE: 106 mg/dL — AB (ref 70–99)
Potassium: 4.2 mmol/L (ref 3.5–5.1)
Sodium: 141 mmol/L (ref 135–145)
TOTAL PROTEIN: 7.9 g/dL (ref 6.0–8.3)
Total Bilirubin: 0.5 mg/dL (ref 0.3–1.2)

## 2014-08-14 LAB — CBC WITH DIFFERENTIAL/PLATELET
BASOS ABS: 0.1 10*3/uL (ref 0.0–0.1)
Basophils Relative: 1 % (ref 0–1)
EOS ABS: 0.4 10*3/uL (ref 0.0–0.7)
EOS PCT: 4 % (ref 0–5)
HCT: 41.3 % (ref 39.0–52.0)
Hemoglobin: 14.3 g/dL (ref 13.0–17.0)
LYMPHS PCT: 21 % (ref 12–46)
Lymphs Abs: 2 10*3/uL (ref 0.7–4.0)
MCH: 31.6 pg (ref 26.0–34.0)
MCHC: 34.6 g/dL (ref 30.0–36.0)
MCV: 91.4 fL (ref 78.0–100.0)
Monocytes Absolute: 1.1 10*3/uL — ABNORMAL HIGH (ref 0.1–1.0)
Monocytes Relative: 11 % (ref 3–12)
NEUTROS ABS: 6 10*3/uL (ref 1.7–7.7)
Neutrophils Relative %: 63 % (ref 43–77)
PLATELETS: 243 10*3/uL (ref 150–400)
RBC: 4.52 MIL/uL (ref 4.22–5.81)
RDW: 11.7 % (ref 11.5–15.5)
WBC: 9.6 10*3/uL (ref 4.0–10.5)

## 2014-08-14 LAB — D-DIMER, QUANTITATIVE (NOT AT ARMC)

## 2014-08-14 MED ORDER — IOHEXOL 350 MG/ML SOLN
100.0000 mL | Freq: Once | INTRAVENOUS | Status: AC | PRN
  Administered 2014-08-14: 100 mL via INTRAVENOUS

## 2014-08-14 MED ORDER — HEPARIN (PORCINE) IN NACL 100-0.45 UNIT/ML-% IJ SOLN
18.0000 [IU]/kg/h | Freq: Once | INTRAMUSCULAR | Status: AC
  Administered 2014-08-14: 18 [IU]/kg/h via INTRAVENOUS
  Filled 2014-08-14: qty 250

## 2014-08-14 MED ORDER — HEPARIN BOLUS VIA INFUSION
80.0000 [IU]/kg | Freq: Once | INTRAVENOUS | Status: AC
  Administered 2014-08-14: 6608 [IU] via INTRAVENOUS

## 2014-08-14 NOTE — Progress Notes (Signed)
Patient with prior history of PE off of coumadin now for three years. Presented to the ED with shortness of breath and bilateral PE. Will need admission for anticoagulation.   Yevonne PaxSaadat A Khan, MD

## 2014-08-14 NOTE — ED Notes (Signed)
Pt reports intermittent shortness of breath and right calf pain - pt admits to frequent commuting - pt admits to recently experiencing exertional shortness of breath to which he attributed to allergies however pt also admits to hx of PE. Pt pleasant and in no acute distress, speaking complete sentences. Pt denies any chest pain at present.

## 2014-08-14 NOTE — ED Notes (Addendum)
Cough, allergies, SOB, runny nose, congestion x 2-3 weeks Right calf pain 2-3 weeks

## 2014-08-14 NOTE — ED Provider Notes (Signed)
CSN: [id number]     Arrival date & time 08/14/14  1932 History   This chart was scribed for Rolan Bucco, MD by Evon Slack, ED Scribe. This patient was seen in room MH02/MH02 and the patient's care was started at 9:25 PM.     Chief Complaint  Patient presents with  . Shortness of Breath  . Leg Pain    The history is provided by the patient. No language interpreter was used.   HPI Comments: Edgar Miller is a 59 y.o. male who presents to the Emergency Department complaining of SOB that's worse with exertion onset 3 weeks prior. Pt states that he has worsening right calf pain as well. Pt states that he has been commuting several hours frequently between [place] and [place] every weekend. Pt states that he exercises regularly. Pt states that he has been doing this commute since January 2016. Pt states that he has a Hx of PE 3 years prior that improved with warfarin. He was on Coumadin for only about 3-6 months. Pt denies CP or other related symptoms.   Past Medical History  Diagnosis Date  . Pulmonary embolism   . Hypertension    Past Surgical History  Procedure Laterality Date  . Heart ablation    . Ankle surgery     Family History  Problem Relation Age of Onset  . Stroke Mother   . Heart failure Father    History  Substance Use Topics  . Smoking status: Never Smoker   . Smokeless tobacco: Not on file  . Alcohol Use: No    Review of Systems  Constitutional: Negative for fever, chills, diaphoresis and fatigue.  HENT: Negative for congestion, rhinorrhea and sneezing.   Eyes: Negative.   Respiratory: Positive for shortness of breath. Negative for cough and chest tightness.   Cardiovascular: Negative for chest pain and leg swelling.  Gastrointestinal: Negative for nausea, vomiting, abdominal pain, diarrhea and blood in stool.  Genitourinary: Negative for frequency, hematuria, flank pain and difficulty urinating.  Musculoskeletal: Positive for myalgias. Negative  for back pain and arthralgias.  Skin: Negative for rash.  Neurological: Negative for dizziness, speech difficulty, weakness, numbness and headaches.  All other systems reviewed and are negative.   Allergies  Penicillins  Home Medications   Prior to Admission medications   Medication Sig Start Date End Date Taking? Authorizing Provider  levothyroxine (SYNTHROID, LEVOTHROID) 100 MCG tablet Take 100 mcg by mouth daily before breakfast.    Historical Provider, MD  lisinopril (PRINIVIL,ZESTRIL) 10 MG tablet Take 10 mg by mouth daily.    Historical Provider, MD   BP 143/90 mmHg  Pulse 91  Resp 18  SpO2 95%   Physical Exam  Constitutional: He is oriented to person, place, and time. He appears well-developed and well-nourished.  HENT:  Head: Normocephalic and atraumatic.  Eyes: Pupils are equal, round, and reactive to light.  Neck: Normal range of motion. Neck supple.  Cardiovascular: Normal rate, regular rhythm and normal heart sounds.   Pulmonary/Chest: Effort normal and breath sounds normal. No respiratory distress. He has no wheezes. He has no rales. He exhibits no tenderness.  Abdominal: Soft. Bowel sounds are normal. There is no tenderness. There is no rebound and no guarding.  Musculoskeletal: Normal range of motion. He exhibits edema and tenderness.  Mild edema and tenderness to the right calf  Lymphadenopathy:    He has no cervical adenopathy.  Neurological: He is alert and oriented to person, place, and time.  Skin:  Skin is warm and dry. No rash noted.  Psychiatric: He has a normal mood and affect.    ED Course  Procedures (including critical care time) DIAGNOSTIC STUDIES: Oxygen Saturation is 94% on RA, normal by my interpretation.    COORDINATION OF CARE: 10:20 PM-Discussed treatment plan with pt at bedside and pt agreed to plan.     Labs Review Labs Reviewed  CBC WITH DIFFERENTIAL/PLATELET - Abnormal; Notable for the following:    Monocytes Absolute 1.1 (*)     All other components within normal limits  COMPREHENSIVE METABOLIC PANEL - Abnormal; Notable for the following:    Glucose, Bld 106 (*)    BUN 24 (*)    GFR calc non Af Amer 64 (*)    GFR calc Af Amer 74 (*)    All other components within normal limits  D-DIMER, QUANTITATIVE - Abnormal; Notable for the following:    D-Dimer, Quant >20.00 (*)    All other components within normal limits    Imaging Review Dg Chest 2 View  08/14/2014   CLINICAL DATA:  Acute onset of cough and congestion. Right calf pain. Initial encounter.  EXAM: CHEST  2 VIEW  COMPARISON:  None.  FINDINGS: The lungs are well-aerated and clear. There is no evidence of focal opacification, pleural effusion or pneumothorax.  The heart is normal in size; the mediastinal contour is within normal limits. No acute osseous abnormalities are seen.  IMPRESSION: No acute cardiopulmonary process seen.   Electronically Signed   By: Roanna RaiderJeffery  Chang M.D.   On: 08/14/2014 21:06   Ct Angio Chest Pe W/cm &/or Wo Cm  08/14/2014   CLINICAL DATA:  Elevated D-dimer. Subacute onset of shortness of breath, with cough and congestion. History of pulmonary embolus. Initial encounter.  EXAM: CT ANGIOGRAPHY CHEST WITH CONTRAST  TECHNIQUE: Multidetector CT imaging of the chest was performed using the standard protocol during bolus administration of intravenous contrast. Multiplanar CT image reconstructions and MIPs were obtained to evaluate the vascular anatomy.  CONTRAST:  100mL OMNIPAQUE IOHEXOL 350 MG/ML SOLN  COMPARISON:  Chest radiograph performed earlier today at 9:12 p.m.  FINDINGS: There is pulmonary embolus within both main pulmonary arteries, extending distally into segmental and subsegmental branches. This involves all lobes of both lungs. The RV/LV ratio of 1.5 corresponds to right heart strain and concern for submassive pulmonary embolus.  Minimal left lingular atelectasis or scarring is noted. The lungs are otherwise clear. There is no evidence of  pleural effusion or pneumothorax. No masses are identified; no abnormal focal contrast enhancement is seen.  The mediastinum is otherwise unremarkable in appearance. No mediastinal lymphadenopathy is seen. No pericardial effusion is identified. The great vessels are grossly unremarkable in appearance. The thyroid gland is markedly diminutive and grossly unremarkable. No axillary lymphadenopathy is seen.  A 0.7 cm hypodensity within the right hepatic lobe is nonspecific in appearance. The visualized portions of the pancreas and adrenal glands are within normal limits. The gallbladder is unremarkable. A small nonspecific 5 mm node is seen adjacent to the distal esophagus, likely within normal limits.  No acute osseous abnormalities are seen.  Review of the MIP images confirms the above findings.  IMPRESSION: 1. Pulmonary embolus within both main pulmonary arteries, extending distally into segmental and subsegmental branches. This involves all lobes of both lungs. Large clot burden noted. RV/LV ratio of 1.5 corresponds to right heart strain and concern for submassive pulmonary embolus. 2. Minimal left lingular atelectasis or scarring noted. Lungs otherwise clear. 3.  Nonspecific 7 mm hypodensity within the right hepatic lobe, likely reflecting a cyst.  Critical Value/emergent results were called by telephone at the time of interpretation on 08/14/2014 at 10:12 pm to Mallie Snooks RN, who verbally acknowledged these results.   Electronically Signed   By: Roanna Raider M.D.   On: 08/14/2014 22:19   US Venous Img Lower Unilateral Right  08/14/2014   CLINICAL DATA:  Right upper calf pain for 2-3 weeks. Elevated D-dimer. Shortness of breath. Initial encounter.  EXAM: RIGHT LOWER EXTREMITY VENOUS DOPPLER ULTRASOUND  TECHNIQUE: Gray-scale sonography with graded compression, as well as color Doppler and duplex ultrasound were performed to evaluate the lower extremity deep venous systems from the level of the common femoral  vein and including the common femoral, femoral, profunda femoral, popliteal and calf veins including the posterior tibial, peroneal and gastrocnemius veins when visible. The superficial great saphenous vein was also interrogated. Spectral Doppler was utilized to evaluate flow at rest and with distal augmentation maneuvers in the common femoral, femoral and popliteal veins.  COMPARISON:  None.  FINDINGS: Contralateral Common Femoral Vein: Respiratory phasicity is normal and symmetric with the symptomatic side. No evidence of thrombus. Normal compressibility.  Common Femoral Vein: No evidence of thrombus. Normal compressibility, respiratory phasicity and response to augmentation.  Saphenofemoral Junction: No evidence of thrombus. Normal compressibility and flow on color Doppler imaging.  Profunda Femoral Vein: No evidence of thrombus. Normal compressibility and flow on color Doppler imaging.  Femoral Vein: No evidence of thrombus. Normal compressibility, respiratory phasicity and response to augmentation.  Popliteal Vein: No evidence of thrombus. Normal compressibility, respiratory phasicity and response to augmentation.  Calf Veins: Occlusive thrombus is noted within the proximal posterior tibial vein, just before its junction with the popliteal vein.  Superficial Great Saphenous Vein: No evidence of thrombus. Normal compressibility and flow on color Doppler imaging.  Venous Reflux:  None.  Other Findings:  None.  IMPRESSION: Occlusive deep venous thrombosis within the proximal posterior tibial vein.  These results were called by telephone at the time of interpretation on 08/14/2014 at 10:09 pm to Mallie Snooks RN, who verbally acknowledged these results.   Electronically Signed   By: Roanna Raider M.D.   On: 08/14/2014 22:12     EKG Interpretation   Date/Time:  Thursday August 14 2014 20:04:12 EDT Ventricular Rate:  101 PR Interval:  122 QRS Duration: 76 QT Interval:  352 QTC Calculation: 456 R Axis:    41 Text Interpretation:  Sinus tachycardia T wave abnormality, consider  inferior ischemia T wave abnormality, consider anterior ischemia Abnormal  ECG No old tracing to compare Confirmed by Rylen Hou  MD, Tyquarius Paglia ([id number]) on  08/14/2014 11:04:15 PM      MDM   Final diagnoses:  Pulmonary emboli  DVT (deep venous thrombosis), right   Patient has bilateral large pulmonary emboli. His oxygen saturation is normal. His blood pressure is stable. He was started on heparin bolus and followed by heparin drip. I consult with the hospitalist on-call, Dr. Welton Flakes who has accepted the patient for transfer and admission to [facility] to a telemetry floor.   I personally performed the services described in this documentation, which was scribed in my presence.  The recorded information has been reviewed and considered.      Rolan Bucco, MD 08/14/14 [phone]

## 2014-08-14 NOTE — ED Provider Notes (Signed)
CSN: [id number]     Arrival date & time 08/14/14  1814 History   First MD Initiated Contact with Patient 08/14/14 1824     Chief Complaint  Patient presents with  . Cough  . Leg Pain   [facility] 6:50 PM HPI Chief complaint: Right calf pain and shortness of breath, worsening 2 weeks.  59 year old [place], [place] time. He is been driving a lot. He reports that he is in excellent physical condition and normally exercises vigorously without any shortness of breath. He recalls no recent trauma.  Right posterior-lateral calf pain is sharp, 5 out of 10 in intensity without radiation. No paresthesias or focal weakness. Associated with dyspnea at rest and especially on exertion. Denies chest pain or palpitations.  He also has mild seasonal sinus allergies with clear rhinorrhea and rare nonproductive cough. No fever. No abdominal or GI symptoms  Past medical history pertinent for pulmonary embolus 2013  Past Medical History  Diagnosis Date  . Pulmonary embolism    Past Surgical History  Procedure Laterality Date  . Heart ablation     Family History  Problem Relation Age of Onset  . Stroke Mother   . Heart failure Father    History  Substance Use Topics  . Smoking status: Never Smoker   . Smokeless tobacco: Not on file  . Alcohol Use: No    Review of Systems  All other systems reviewed and are negative.  see above in history of present illness  Allergies  Penicillins  Home Medications   Prior to Admission medications   Medication Sig Start Date End Date Taking? Authorizing Provider  levothyroxine (SYNTHROID, LEVOTHROID) 100 MCG tablet Take 100 mcg by mouth daily before breakfast.   Yes Historical Provider, MD  lisinopril (PRINIVIL,ZESTRIL) 10 MG tablet Take 10 mg by mouth daily.   Yes Historical Provider, MD   BP 149/98 mmHg  Pulse 86  Temp(Src) 97.8 F (36.6 C) (Oral)   Ht 5\' 8"  (1.727 m)  Wt 182 lb (82.555 kg)  BMI 27.68 kg/m2  SpO2 94% Physical Exam  Constitutional: He is oriented to person, place, and time. He appears well-developed and well-nourished. No distress.  No distress, but he is mildly dyspneic at rest. He appears worried. Pleasant, cooperative, articulate male. Pulse ox 94% on room air  HENT:  Head: Normocephalic and atraumatic.  Nose with slightly boggy turbinates, slight serous drainage  Eyes: Conjunctivae and EOM are normal. Pupils are equal, round, and reactive to light. No scleral icterus.  Neck: Normal range of motion. No JVD present.  Cardiovascular: Normal rate, regular rhythm and normal heart sounds.   No murmur heard. Pulmonary/Chest: Effort normal and breath sounds normal.  Abdominal: He exhibits no distension.  Musculoskeletal: Normal range of motion.  Right calf: Tense, very tender, swollen mid right lateral calf. Positive Homans sign.  Lymphadenopathy:    He has no cervical adenopathy.  Neurological: He is alert and oriented to person, place, and time.  Skin: Skin is warm. No rash noted.  Psychiatric: He has a normal mood and affect.  Nursing note and vitals reviewed.   ED Course  Procedures (including critical care time) Labs Review Labs Reviewed - No data to display  Imaging Review No results found.   MDM   1. Right leg pain   2. DVT (deep venous thrombosis), right    Based on history and physical, I am  very suspicious that he has right DVT. Pulse ox 94%, which is lower than to be expected for a healthy male who usually exercises vigorously.-He might also have PE.  He has positive right Homans sign.  Also, past medical history of PE 2013.  Here at [facility], we do not have the facilities to further evaluate and treat him. After options discussed, he will go to [facility] ER now for further evaluation and treatment.     Lajean Manes, MD 08/14/14 [phone]

## 2014-08-15 ENCOUNTER — Encounter (HOSPITAL_COMMUNITY): Payer: Self-pay | Admitting: *Deleted

## 2014-08-15 DIAGNOSIS — E039 Hypothyroidism, unspecified: Secondary | ICD-10-CM

## 2014-08-15 DIAGNOSIS — I82409 Acute embolism and thrombosis of unspecified deep veins of unspecified lower extremity: Secondary | ICD-10-CM

## 2014-08-15 DIAGNOSIS — I2699 Other pulmonary embolism without acute cor pulmonale: Principal | ICD-10-CM

## 2014-08-15 DIAGNOSIS — I82401 Acute embolism and thrombosis of unspecified deep veins of right lower extremity: Secondary | ICD-10-CM

## 2014-08-15 DIAGNOSIS — I10 Essential (primary) hypertension: Secondary | ICD-10-CM

## 2014-08-15 LAB — PROTIME-INR
INR: 1.07 (ref 0.00–1.49)
INR: 1.24 (ref 0.00–1.49)
PROTHROMBIN TIME: 13.9 s (ref 11.6–15.2)
Prothrombin Time: 15.8 seconds — ABNORMAL HIGH (ref 11.6–15.2)

## 2014-08-15 LAB — CBC WITH DIFFERENTIAL/PLATELET
BASOS ABS: 0.1 10*3/uL (ref 0.0–0.1)
Basophils Relative: 1 % (ref 0–1)
Eosinophils Absolute: 0.5 10*3/uL (ref 0.0–0.7)
Eosinophils Relative: 6 % — ABNORMAL HIGH (ref 0–5)
HCT: 39.5 % (ref 39.0–52.0)
Hemoglobin: 13.6 g/dL (ref 13.0–17.0)
LYMPHS ABS: 2 10*3/uL (ref 0.7–4.0)
Lymphocytes Relative: 25 % (ref 12–46)
MCH: 31.2 pg (ref 26.0–34.0)
MCHC: 34.4 g/dL (ref 30.0–36.0)
MCV: 90.6 fL (ref 78.0–100.0)
Monocytes Absolute: 0.9 10*3/uL (ref 0.1–1.0)
Monocytes Relative: 10 % (ref 3–12)
Neutro Abs: 4.7 10*3/uL (ref 1.7–7.7)
Neutrophils Relative %: 58 % (ref 43–77)
PLATELETS: 205 10*3/uL (ref 150–400)
RBC: 4.36 MIL/uL (ref 4.22–5.81)
RDW: 12 % (ref 11.5–15.5)
WBC: 8.2 10*3/uL (ref 4.0–10.5)

## 2014-08-15 LAB — COMPREHENSIVE METABOLIC PANEL
ALT: 19 U/L (ref 0–53)
AST: 19 U/L (ref 0–37)
Albumin: 3.2 g/dL — ABNORMAL LOW (ref 3.5–5.2)
Alkaline Phosphatase: 57 U/L (ref 39–117)
Anion gap: 9 (ref 5–15)
BILIRUBIN TOTAL: 0.7 mg/dL (ref 0.3–1.2)
BUN: 21 mg/dL (ref 6–23)
CO2: 20 mmol/L (ref 19–32)
Calcium: 9 mg/dL (ref 8.4–10.5)
Chloride: 111 mmol/L (ref 96–112)
Creatinine, Ser: 1.16 mg/dL (ref 0.50–1.35)
GFR calc Af Amer: 78 mL/min — ABNORMAL LOW (ref >90)
GFR calc non Af Amer: 67 mL/min — ABNORMAL LOW (ref >90)
Glucose, Bld: 77 mg/dL (ref 70–99)
Potassium: 4.2 mmol/L (ref 3.5–5.1)
SODIUM: 140 mmol/L (ref 135–145)
TOTAL PROTEIN: 6.3 g/dL (ref 6.0–8.3)

## 2014-08-15 LAB — BRAIN NATRIURETIC PEPTIDE: B Natriuretic Peptide: 125.2 pg/mL — ABNORMAL HIGH (ref 0.0–100.0)

## 2014-08-15 LAB — HEPARIN LEVEL (UNFRACTIONATED)
Heparin Unfractionated: 1.06 IU/mL — ABNORMAL HIGH (ref 0.30–0.70)
Heparin Unfractionated: 0.53 IU/mL (ref 0.30–0.70)

## 2014-08-15 LAB — TROPONIN I: TROPONIN I: 0.08 ng/mL — AB (ref <0.031)

## 2014-08-15 LAB — APTT
aPTT: 33 seconds (ref 24–37)
aPTT: 163 seconds — ABNORMAL HIGH (ref 24–37)

## 2014-08-15 LAB — SEDIMENTATION RATE: SED RATE: 18 mm/h — AB (ref 0–16)

## 2014-08-15 MED ORDER — LISINOPRIL 10 MG PO TABS
10.0000 mg | ORAL_TABLET | Freq: Every day | ORAL | Status: DC
  Administered 2014-08-15 – 2014-08-16 (×2): 10 mg via ORAL
  Filled 2014-08-15 (×2): qty 1

## 2014-08-15 MED ORDER — HEPARIN (PORCINE) IN NACL 100-0.45 UNIT/ML-% IJ SOLN
1500.0000 [IU]/h | INTRAMUSCULAR | Status: DC
  Filled 2014-08-15 (×3): qty 250

## 2014-08-15 MED ORDER — HEPARIN (PORCINE) IN NACL 100-0.45 UNIT/ML-% IJ SOLN
1300.0000 [IU]/h | INTRAMUSCULAR | Status: DC
  Administered 2014-08-15 (×2): 1300 [IU]/h via INTRAVENOUS
  Filled 2014-08-15 (×2): qty 250

## 2014-08-15 MED ORDER — ONDANSETRON HCL 4 MG/2ML IJ SOLN: 4.0000 mg | Freq: Four times a day (QID) | INTRAMUSCULAR | Status: DC | PRN

## 2014-08-15 MED ORDER — ACETAMINOPHEN 650 MG RE SUPP: 650.0000 mg | Freq: Four times a day (QID) | RECTAL | Status: DC | PRN

## 2014-08-15 MED ORDER — LEVOTHYROXINE SODIUM 100 MCG PO TABS
100.0000 ug | ORAL_TABLET | Freq: Every day | ORAL | Status: DC
  Administered 2014-08-15 – 2014-08-16 (×2): 100 ug via ORAL
  Filled 2014-08-15 (×3): qty 1

## 2014-08-15 MED ORDER — ONDANSETRON HCL 4 MG PO TABS: 4.0000 mg | ORAL_TABLET | Freq: Four times a day (QID) | ORAL | Status: DC | PRN

## 2014-08-15 MED ORDER — ACETAMINOPHEN 325 MG PO TABS
650.0000 mg | ORAL_TABLET | Freq: Four times a day (QID) | ORAL | Status: DC | PRN
  Administered 2014-08-16 (×2): 650 mg via ORAL
  Filled 2014-08-15 (×2): qty 2

## 2014-08-15 MED ORDER — FLUTICASONE PROPIONATE 50 MCG/ACT NA SUSP
2.0000 | Freq: Every day | NASAL | Status: DC
  Administered 2014-08-15: 2 via NASAL
  Filled 2014-08-15: qty 16

## 2014-08-15 MED ORDER — SODIUM CHLORIDE 0.9 % IV SOLN
INTRAVENOUS | Status: DC
  Administered 2014-08-15: 1000 mL via INTRAVENOUS
  Administered 2014-08-15: 02:00:00 via INTRAVENOUS

## 2014-08-15 NOTE — Progress Notes (Signed)
ANTICOAGULATION CONSULT NOTE - Follow-up  Pharmacy Consult for Heparin  Indication: pulmonary embolus, DVT  Allergies  Allergen Reactions  . Penicillins Other (See Comments)    Childhood allergy    Patient Measurements: Height: 5\' 8"  (172.7 cm) Weight: 180 lb 1.9 oz (81.7 kg) IBW/kg (Calculated) : 68.4  Vital Signs: Temp: 97.7 F (36.5 C) (03/18 0533) Temp Source: Oral (03/18 0533) BP: 127/85 mmHg (03/18 0533) Pulse Rate: 91 (03/17 2259)  Labs:  Recent Labs  08/14/14 2030 08/15/14 0643  HGB 14.3 13.6  HCT 41.3 39.5  PLT 243 205  APTT 33 163*  LABPROT 13.9 15.8*  INR 1.07 1.24  HEPARINUNFRC  --  1.06*  CREATININE 1.21  --    Estimated Creatinine Clearance: 63.6 mL/min (by C-G formula based on Cr of 1.21).  Assessment: 59 [facility] 1500 units/hr of heparin for new onset DVT/PE. Initial heparin level is supratherapeutic at 1.06. H/H and platelets are WNL. No bleeding noted.   Goal of Therapy:  Heparin level 0.3-0.7 units/ml Monitor platelets by anticoagulation protocol: Yes   Plan:  - Hold heparin x 30 minutes - Restart heparin at 1300 units/hr - Check an 8 hour heparin level - Daily heparin level and CBC - F/u plans for oral anticoag  Lysle Pearlachel Jubilee Vivero, PharmD, BCPS Pager # [phone] 08/15/2014 8:39 AM

## 2014-08-15 NOTE — Progress Notes (Addendum)
Patient Demographics  Edgar Miller, is a 59 y.o. male, DOB - 11-02-1955, JEH:[id number]  Admit date - 08/14/2014   Admitting Physician Allyne Gee, MD  Outpatient Primary MD for the patient is PROVIDER NOT [place]  LOS - 1   Chief Complaint  Patient presents with  . Shortness of Breath  . Leg Pain     admission history of present illness/brief narrative: Edgar Miller is a 59 y.o. male with history of hypertension and hypothyroidism and previous history of pulmonary embolism 3 years ago presents to the ER with complaints of increasing shortness of breath over the last 3 weeks. Patient states over the last 3 months he has been traveling from [place] to [place] very often. Patient had originally gone to an urgent care and was referred to the ER at [place] at [facility]. In the ER patient had a CT angiogram of the chest which showed pulmonary embolism involving bilateral pulmonary arteries with Doppler showing right lower extremity DVT. On exam patient has lower extremity swelling. Patient was started on heparin drip.   Subjective:   Edgar Miller today has, No headache, No chest pain, No abdominal pain - No Nausea, No new weakness tingling or numbness, No Cough - SOB.   Assessment & Plan    Principal Problem:   Pulmonary emboli Active Problems:   DVT (deep venous thrombosis)   Essential hypertension   Hypothyroidism   PE (pulmonary embolism)  VTE/ pulmonary embolism on the right lower extremity DVT - Continue with heparin drip for the next 24 hours given high clot burden, then transitioned to NOAC. - Follow on 2-D echo - Will need lifelong anticoagulation, follow on gene mutation, ANA, ESR.  Elevated troponin - This is in the setting of submassive PE - 2-D echo pending  Hypertension - Continue with home medication  Hypothyroidism -  Continue with Synthroid  Code Status: Full  Family Communication: None at bedside  Disposition Plan: Home once stable   Procedures     Consults  PCCM   Medications  Scheduled Meds: . fluticasone  2 spray Each Nare Daily  . levothyroxine  100 mcg Oral QAC breakfast  . lisinopril  10 mg Oral Daily   Continuous Infusions: . heparin 1,300 Units/hr (08/15/14 1116)   PRN Meds:.acetaminophen **OR** acetaminophen, ondansetron **OR** ondansetron (ZOFRAN) IV  DVT Prophylaxis  heparin drip  Lab Results  Component Value Date   PLT 205 08/15/2014    Antibiotics    Anti-infectives    None          Objective:   Filed Vitals:   08/15/14 0039 08/15/14 0039 08/15/14 0533 08/15/14 0534  BP:  144/95 127/85   Pulse:      Temp:  97.8 F (36.6 C) 97.7 F (36.5 C)   TempSrc:  Oral Oral   Resp:  16 20   Height: '5\' 8"'  (1.727 m)     Weight: 81.4 kg (179 lb 7.3 oz)   81.7 kg (180 lb 1.9 oz)  SpO2:  93% 96%     Wt Readings from Last 3 Encounters:  08/15/14 81.7 kg (180 lb 1.9 oz)  08/14/14 82.555 kg (182 lb)     Intake/Output Summary (Last 24 hours) at 08/15/14  1329 Last data filed at 08/15/14 1058  Gross per 24 hour  Intake 1084.33 ml  Output    925 ml  Net 159.33 ml     Physical Exam  Awake Alert, Oriented X 3, No new F.N deficits, Normal affect [place].AT,PERRAL Supple Neck,No JVD, No cervical lymphadenopathy appriciated.  Symmetrical Chest wall movement, Good air movement bilaterally, CTAB RRR,No Gallops,Rubs or new Murmurs, No Parasternal Heave +ve B.Sounds, Abd Soft, No tenderness, No organomegaly appriciated, No rebound - guarding or rigidity. No Cyanosis, Clubbing , mild edema in right lower extremity , No new Rash or bruise     Data Review   Micro Results No results found for this or any previous visit (from the past 240 hour(s)).  Radiology Reports Dg Chest 2 View  08/14/2014   CLINICAL DATA:  Acute onset of cough and congestion. Right calf pain.  Initial encounter.  EXAM: CHEST  2 VIEW  COMPARISON:  None.  FINDINGS: The lungs are well-aerated and clear. There is no evidence of focal opacification, pleural effusion or pneumothorax.  The heart is normal in size; the mediastinal contour is within normal limits. No acute osseous abnormalities are seen.  IMPRESSION: No acute cardiopulmonary process seen.   Electronically Signed   By: Garald Balding M.D.   On: 08/14/2014 21:06   Ct Angio Chest Pe W/cm &/or Wo Cm  08/14/2014   CLINICAL DATA:  Elevated D-dimer. Subacute onset of shortness of breath, with cough and congestion. History of pulmonary embolus. Initial encounter.  EXAM: CT ANGIOGRAPHY CHEST WITH CONTRAST  TECHNIQUE: Multidetector CT imaging of the chest was performed using the standard protocol during bolus administration of intravenous contrast. Multiplanar CT image reconstructions and MIPs were obtained to evaluate the vascular anatomy.  CONTRAST:  155m OMNIPAQUE IOHEXOL 350 MG/ML SOLN  COMPARISON:  Chest radiograph performed earlier today at 9:12 p.m.  FINDINGS: There is pulmonary embolus within both main pulmonary arteries, extending distally into segmental and subsegmental branches. This involves all lobes of both lungs. The RV/LV ratio of 1.5 corresponds to right heart strain and concern for submassive pulmonary embolus.  Minimal left lingular atelectasis or scarring is noted. The lungs are otherwise clear. There is no evidence of pleural effusion or pneumothorax. No masses are identified; no abnormal focal contrast enhancement is seen.  The mediastinum is otherwise unremarkable in appearance. No mediastinal lymphadenopathy is seen. No pericardial effusion is identified. The great vessels are grossly unremarkable in appearance. The thyroid gland is markedly diminutive and grossly unremarkable. No axillary lymphadenopathy is seen.  A 0.7 cm hypodensity within the right hepatic lobe is nonspecific in appearance. The visualized portions of the  pancreas and adrenal glands are within normal limits. The gallbladder is unremarkable. A small nonspecific 5 mm node is seen adjacent to the distal esophagus, likely within normal limits.  No acute osseous abnormalities are seen.  Review of the MIP images confirms the above findings.  IMPRESSION: 1. Pulmonary embolus within both main pulmonary arteries, extending distally into segmental and subsegmental branches. This involves all lobes of both lungs. Large clot burden noted. RV/LV ratio of 1.5 corresponds to right heart strain and concern for submassive pulmonary embolus. 2. Minimal left lingular atelectasis or scarring noted. Lungs otherwise clear. 3. Nonspecific 7 mm hypodensity within the right hepatic lobe, likely reflecting a cyst.  Critical Value/emergent results were called by telephone at the time of interpretation on 08/14/2014 at 10:12 pm to KOrpah CobbRN, who verbally acknowledged these results.   Electronically Signed  By: Garald Balding M.D.   On: 08/14/2014 22:19   US Venous Img Lower Unilateral Right  08/14/2014   CLINICAL DATA:  Right upper calf pain for 2-3 weeks. Elevated D-dimer. Shortness of breath. Initial encounter.  EXAM: RIGHT LOWER EXTREMITY VENOUS DOPPLER ULTRASOUND  TECHNIQUE: Gray-scale sonography with graded compression, as well as color Doppler and duplex ultrasound were performed to evaluate the lower extremity deep venous systems from the level of the common femoral vein and including the common femoral, femoral, profunda femoral, popliteal and calf veins including the posterior tibial, peroneal and gastrocnemius veins when visible. The superficial great saphenous vein was also interrogated. Spectral Doppler was utilized to evaluate flow at rest and with distal augmentation maneuvers in the common femoral, femoral and popliteal veins.  COMPARISON:  None.  FINDINGS: Contralateral Common Femoral Vein: Respiratory phasicity is normal and symmetric with the symptomatic side. No  evidence of thrombus. Normal compressibility.  Common Femoral Vein: No evidence of thrombus. Normal compressibility, respiratory phasicity and response to augmentation.  Saphenofemoral Junction: No evidence of thrombus. Normal compressibility and flow on color Doppler imaging.  Profunda Femoral Vein: No evidence of thrombus. Normal compressibility and flow on color Doppler imaging.  Femoral Vein: No evidence of thrombus. Normal compressibility, respiratory phasicity and response to augmentation.  Popliteal Vein: No evidence of thrombus. Normal compressibility, respiratory phasicity and response to augmentation.  Calf Veins: Occlusive thrombus is noted within the proximal posterior tibial vein, just before its junction with the popliteal vein.  Superficial Great Saphenous Vein: No evidence of thrombus. Normal compressibility and flow on color Doppler imaging.  Venous Reflux:  None.  Other Findings:  None.  IMPRESSION: Occlusive deep venous thrombosis within the proximal posterior tibial vein.  These results were called by telephone at the time of interpretation on 08/14/2014 at 10:09 pm to Orpah Cobb RN, who verbally acknowledged these results.   Electronically Signed   By: Garald Balding M.D.   On: 08/14/2014 22:12    CBC  Recent Labs Lab 08/14/14 2030 08/15/14 0643  WBC 9.6 8.2  HGB 14.3 13.6  HCT 41.3 39.5  PLT 243 205  MCV 91.4 90.6  MCH 31.6 31.2  MCHC 34.6 34.4  RDW 11.7 12.0  LYMPHSABS 2.0 2.0  MONOABS 1.1* 0.9  EOSABS 0.4 0.5  BASOSABS 0.1 0.1    Chemistries   Recent Labs Lab 08/14/14 2030 08/15/14 0643  NA 141 140  K 4.2 4.2  CL 107 111  CO2 24 20  GLUCOSE 106* 77  BUN 24* 21  CREATININE 1.21 1.16  CALCIUM 9.4 9.0  AST 22 19  ALT 22 19  ALKPHOS 66 57  BILITOT 0.5 0.7   ------------------------------------------------------------------------------------------------------------------ estimated creatinine clearance is 66.3 mL/min (by C-G formula based on Cr of  1.16). ------------------------------------------------------------------------------------------------------------------ No results for input(s): HGBA1C in the last 72 hours. ------------------------------------------------------------------------------------------------------------------ No results for input(s): CHOL, HDL, LDLCALC, TRIG, CHOLHDL, LDLDIRECT in the last 72 hours. ------------------------------------------------------------------------------------------------------------------ No results for input(s): TSH, T4TOTAL, T3FREE, THYROIDAB in the last 72 hours.  Invalid input(s): FREET3 ------------------------------------------------------------------------------------------------------------------ No results for input(s): VITAMINB12, FOLATE, FERRITIN, TIBC, IRON, RETICCTPCT in the last 72 hours.  Coagulation profile  Recent Labs Lab 08/14/14 2030 08/15/14 0643  INR 1.07 1.24     Recent Labs  08/14/14 2030  DDIMER >20.00*    Cardiac Enzymes  Recent Labs Lab 08/15/14 0643  TROPONINI 0.08*   ------------------------------------------------------------------------------------------------------------------ Invalid input(s): POCBNP     Time Spent in minutes   30 minutes   Raden Byington,  Saysha Menta M.D on 08/15/2014 at 1:29 PM  Between 7am to 7pm - Pager - [phone]  After 7pm go to [email or web address] - password [email or web address]  And look for the night coverage person covering for me after hours  [facility] Office  [phone]   **Disclaimer: This note may have been dictated with voice recognition software. Similar sounding words can inadvertently be transcribed and this note may contain transcription errors which may not have been corrected upon publication of note.**

## 2014-08-15 NOTE — Progress Notes (Signed)
  Echocardiogram 2D Echocardiogram has been performed.  Delcie RochENNINGTON, Ayham Word 08/15/2014, 5:50 PM

## 2014-08-15 NOTE — Progress Notes (Signed)
ANTICOAGULATION CONSULT NOTE - Follow-up  Pharmacy Consult for Heparin  Indication: pulmonary embolus, DVT  Allergies  Allergen Reactions  . Penicillins Other (See Comments)    Childhood allergy    Patient Measurements: Height: 5\' 8"  (172.7 cm) Weight: 180 lb 1.9 oz (81.7 kg) IBW/kg (Calculated) : 68.4  Vital Signs: Temp: 98.2 F (36.8 C) (03/18 1359) Temp Source: Oral (03/18 1359) BP: 128/79 mmHg (03/18 1359) Pulse Rate: 84 (03/18 1359)  Labs:  Recent Labs  08/14/14 2030 08/15/14 0643 08/15/14 1615  HGB 14.3 13.6  --   HCT 41.3 39.5  --   PLT 243 205  --   APTT 33 163*  --   LABPROT 13.9 15.8*  --   INR 1.07 1.24  --   HEPARINUNFRC  --  1.06* 0.53  CREATININE 1.21 1.16  --   TROPONINI  --  0.08*  --    Estimated Creatinine Clearance: 66.3 mL/min (by C-G formula based on Cr of 1.16).  Assessment: 59 [facility] 1500 units/hr of heparin for new onset DVT/PE. Initial heparin level is supratherapeutic at 1.06. H/H and platelets are WNL. No bleeding noted.   Heparin was held and rate reduced to 1300 units/hr.  Heparin level was therapeutic on this rate.  Continue 1300 units/hr and follow-up with AM labs for confirmation.  Goal of Therapy:  Heparin level 0.3-0.7 units/ml Monitor platelets by anticoagulation protocol: Yes   Plan:  Continue heparin at 1300 units/hr Next heparin level with AM labs Daily heparin level and CBC F/u plans for oral anticoag  Toys 'R' UsKimberly Stuart Guillen, Pharm.D., BCPS Clinical Pharmacist Pager [phone] 08/15/2014 7:09 PM

## 2014-08-15 NOTE — H&P (Signed)
[facility] History and Physical  Edgar Miller BJY:[id number] DOB: 01-17-1956 DOA: 08/14/2014  Referring physician: Patient was transferred from [facility] [place]. PCP: PROVIDER NOT IN SYSTEM patient is from [place].  Chief Complaint: Shortness of breath.  HPI: Edgar Miller is a 59 y.o. male with history of hypertension and hypothyroidism and previous history of pulmonary embolism 3 years ago presents to the ER with complaints of increasing shortness of breath over the last 3 weeks. Patient states over the last 3 months he has been traveling from [place] to [place] very often. Patient had originally gone to an urgent care and was referred [facility] the ER [facility]. In the ER patient had a CT angiogram of the chest which showed pulmonary embolism involving bilateral pulmonary arteries with Doppler showing right lower extremity DVT. On exam patient has lower extremity swelling. Patient denies any chest pain dizziness or loss of consciousness or palpitations. Patient has been admitted for further management.   Review of Systems: As presented in the history of presenting illness, rest negative.  Past Medical History  Diagnosis Date  . Pulmonary embolism   . Hypertension   . Hypothyroidism    Past Surgical History  Procedure Laterality Date  . Heart ablation    . Ankle surgery     Social History:  reports that he has never smoked. He does not have any smokeless tobacco history on file. He reports that he does not drink alcohol. His drug history is not on file. Where does patient live home. Can patient participate in ADLs? Yes.  Allergies  Allergen Reactions  . Penicillins     Family History:  Family History  Problem Relation Age of Onset  . Stroke Mother   . Heart failure Father       Prior to Admission medications   Medication Sig Start Date End Date Taking? Authorizing Provider  levothyroxine (SYNTHROID, LEVOTHROID) 100 MCG tablet Take 100  mcg by mouth daily before breakfast.    Historical Provider, MD  lisinopril (PRINIVIL,ZESTRIL) 10 MG tablet Take 10 mg by mouth daily.    Historical Provider, MD    Physical Exam: Filed Vitals:   08/14/14 2259 08/15/14 0039 08/15/14 0039  BP: 143/90  144/95  Pulse: 91    Temp:   97.8 F (36.6 C)  TempSrc:   Oral  Resp: 18  16  Height:   (1.727 m)   Weight:  81.4 kg (179 lb 7.3 oz)   SpO2: 95%  93%     General:  Well-developed and nourished.  Eyes: Anicteric no pallor.  ENT: No discharge from the ears eyes nose or mouth.  Neck: No mass felt.  Cardiovascular: S1 and S2 heard.  Respiratory: No rhonchi or crepitations.  Abdomen: Soft nontender bowel sounds present.  Skin: No rash.  Musculoskeletal: Mild edema in the right lower extremity.  Psychiatric: Appears normal.  Neurologic: Alert awake oriented to time place and person. Moves all extremities.  Labs on Admission:  Basic Metabolic Panel:  Recent Labs Lab 08/14/14 2030  NA 141  K 4.2  CL 107  CO2 24  GLUCOSE 106*  BUN 24*  CREATININE 1.21  CALCIUM 9.4   Liver Function Tests:  Recent Labs Lab 08/14/14 2030  AST 22  ALT 22  ALKPHOS 66  BILITOT 0.5  PROT 7.9  ALBUMIN 3.7   No results for input(s): LIPASE, AMYLASE in the last 168 hours. No results for input(s): AMMONIA in the last  168 hours. CBC:  Recent Labs Lab 08/14/14 2030  WBC 9.6  NEUTROABS 6.0  HGB 14.3  HCT 41.3  MCV 91.4  PLT 243   Cardiac Enzymes: No results for input(s): CKTOTAL, CKMB, CKMBINDEX, TROPONINI in the last 168 hours.  BNP (last 3 results) No results for input(s): BNP in the last 8760 hours.  ProBNP (last 3 results) No results for input(s): PROBNP in the last 8760 hours.  CBG: No results for input(s): GLUCAP in the last 168 hours.  Radiological Exams on Admission: Dg Chest 2 View  08/14/2014   CLINICAL DATA:  Acute onset of cough and congestion. Right calf pain. Initial encounter.  EXAM: CHEST  2  VIEW  COMPARISON:  None.  FINDINGS: The lungs are well-aerated and clear. There is no evidence of focal opacification, pleural effusion or pneumothorax.  The heart is normal in size; the mediastinal contour is within normal limits. No acute osseous abnormalities are seen.  IMPRESSION: No acute cardiopulmonary process seen.   Electronically Signed   By: Roanna RaiderJeffery  Chang M.D.   On: 08/14/2014 21:06   Ct Angio Chest Pe W/cm &/or Wo Cm  08/14/2014   CLINICAL DATA:  Elevated D-dimer. Subacute onset of shortness of breath, with cough and congestion. History of pulmonary embolus. Initial encounter.  EXAM: CT ANGIOGRAPHY CHEST WITH CONTRAST  TECHNIQUE: Multidetector CT imaging of the chest was performed using the standard protocol during bolus administration of intravenous contrast. Multiplanar CT image reconstructions and MIPs were obtained to evaluate the vascular anatomy.  CONTRAST:  100mL OMNIPAQUE IOHEXOL 350 MG/ML SOLN  COMPARISON:  Chest radiograph performed earlier today at 9:12 p.m.  FINDINGS: There is pulmonary embolus within both main pulmonary arteries, extending distally into segmental and subsegmental branches. This involves all lobes of both lungs. The RV/LV ratio of 1.5 corresponds to right heart strain and concern for submassive pulmonary embolus.  Minimal left lingular atelectasis or scarring is noted. The lungs are otherwise clear. There is no evidence of pleural effusion or pneumothorax. No masses are identified; no abnormal focal contrast enhancement is seen.  The mediastinum is otherwise unremarkable in appearance. No mediastinal lymphadenopathy is seen. No pericardial effusion is identified. The great vessels are grossly unremarkable in appearance. The thyroid gland is markedly diminutive and grossly unremarkable. No axillary lymphadenopathy is seen.  A 0.7 cm hypodensity within the right hepatic lobe is nonspecific in appearance. The visualized portions of the pancreas and adrenal glands are within  normal limits. The gallbladder is unremarkable. A small nonspecific 5 mm node is seen adjacent to the distal esophagus, likely within normal limits.  No acute osseous abnormalities are seen.  Review of the MIP images confirms the above findings.  IMPRESSION: 1. Pulmonary embolus within both main pulmonary arteries, extending distally into segmental and subsegmental branches. This involves all lobes of both lungs. Large clot burden noted. RV/LV ratio of 1.5 corresponds to right heart strain and concern for submassive pulmonary embolus. 2. Minimal left lingular atelectasis or scarring noted. Lungs otherwise clear. 3. Nonspecific 7 mm hypodensity within the right hepatic lobe, likely reflecting a cyst.  Critical Value/emergent results were called by telephone at the time of interpretation on 08/14/2014 at 10:12 pm to Mallie SnooksKelly Connor RN, who verbally acknowledged these results.   Electronically Signed   By: Roanna RaiderJeffery  Chang M.D.   On: 08/14/2014 22:19   [place] Venous Img Lower Unilateral Right  08/14/2014   CLINICAL DATA:  Right upper calf pain for 2-3 weeks. Elevated D-dimer. Shortness of breath.  Initial encounter.  EXAM: RIGHT LOWER EXTREMITY VENOUS DOPPLER ULTRASOUND  TECHNIQUE: Gray-scale sonography with graded compression, as well as color Doppler and duplex ultrasound were performed to evaluate the lower extremity deep venous systems from the level of the common femoral vein and including the common femoral, femoral, profunda femoral, popliteal and calf veins including the posterior tibial, peroneal and gastrocnemius veins when visible. The superficial great saphenous vein was also interrogated. Spectral Doppler was utilized to evaluate flow at rest and with distal augmentation maneuvers in the common femoral, femoral and popliteal veins.  COMPARISON:  None.  FINDINGS: Contralateral Common Femoral Vein: Respiratory phasicity is normal and symmetric with the symptomatic side. No evidence of thrombus. Normal  compressibility.  Common Femoral Vein: No evidence of thrombus. Normal compressibility, respiratory phasicity and response to augmentation.  Saphenofemoral Junction: No evidence of thrombus. Normal compressibility and flow on color Doppler imaging.  Profunda Femoral Vein: No evidence of thrombus. Normal compressibility and flow on color Doppler imaging.  Femoral Vein: No evidence of thrombus. Normal compressibility, respiratory phasicity and response to augmentation.  Popliteal Vein: No evidence of thrombus. Normal compressibility, respiratory phasicity and response to augmentation.  Calf Veins: Occlusive thrombus is noted within the proximal posterior tibial vein, just before its junction with the popliteal vein.  Superficial Great Saphenous Vein: No evidence of thrombus. Normal compressibility and flow on color Doppler imaging.  Venous Reflux:  None.  Other Findings:  None.  IMPRESSION: Occlusive deep venous thrombosis within the proximal posterior tibial vein.  These results were called by telephone at the time of interpretation on 08/14/2014 at 10:09 pm to Mallie Snooks RN, who verbally acknowledged these results.   Electronically Signed   By: Roanna Raider M.D.   On: 08/14/2014 22:12    EKG: Independently reviewed. Sinus tachycardia.  Assessment/Plan Principal Problem:   Pulmonary emboli Active Problems:   DVT (deep venous thrombosis)   Essential hypertension   Hypothyroidism   PE (pulmonary embolism)   1. Pulmonary embolism - patient is hemodynamically stable. Probably provoked but since this is patient's second episode patient probably will need to be on lifelong anticoagulants. Patient especially on heparin infusion and if patient continues to remain stable change to oral anticoagulants. Check 2-D echo troponin and BNP. 2. Hypertension - continue present medications. 3. Hypothyroidism - on Synthroid.   DVT Prophylaxis on heparin infusion.  Code Status: Full code.  Family Communication:  None.  Disposition Plan: Admit to inpatient.    Tamir Wallman N. [facility] Pager [phone].  If 7PM-7AM, please contact night-coverage [email or web address] Password [email or web address] 08/15/2014, 1:49 AM

## 2014-08-15 NOTE — Consult Note (Signed)
Name: Edgar Miller MRN: [id number] DOB: April 13, 1956    ADMISSION DATE:  08/14/2014 CONSULTATION DATE: 3/18  REFERRING MD :  Elgergawy  CHIEF COMPLAINT:  Submassive PE  BRIEF PATIENT DESCRIPTION: 60 year old male w/ prior h/o PE. Admitted on 3/18 w/ new RLE DVT and Acute Pulmonary emboli which involved both main PAs w/ RV/LV ratio of 1.5 and c/w submassive PE). PCCM asked to eval re: potential candidate for catheter directed TPA.   SIGNIFICANT EVENTS    STUDIES:  CT chest 3/17: pulmonary embolus within both main pulmonary arteries, extending distally into segmental and subsegmental branches. This involves all lobes of both lungs. The RV/LV ratio of 1.5 corresponds to right heart strain and concern for submassive pulmonary embolus.Minimal left lingular atelectasis or scarring is noted 3/17 RLE DVT by ultrasound ECHO 3/18: >>  HISTORY OF PRESENT ILLNESS:   59 year old male w/ sig h/o HTN, hypothyroidism and prior PE (3 yrs ago). Presented to the ER 3/17 w/ cc: 3 weeks of progressive dyspnea. Did report that over the last 3 mo he had been traveling back and forth from [place] to [place] frequently. He had initially reported to Urgent care but was referred to ER given his hx. In ED CT of chest was obtained which did indeed confirm suspicion of Pulmonary emboli (which involved both main PAs w/ RV/LV ratio of 1.5 and c/w submassive PE) and LE dopplers demonstrated RLE DVT. He was admitted to the medicine service. Started on IV heparin. PCCM asked to see to evaluate for possible Catheter directed thrombolytics in IR.    PAST MEDICAL HISTORY :   has a past medical history of Pulmonary embolism; Hypertension; and Hypothyroidism.  has past surgical history that includes Heart ablation and Ankle surgery. Prior to Admission medications   Medication Sig Start Date End Date Taking? Authorizing Provider  levocetirizine (XYZAL) 5 MG tablet Take 5 mg by mouth every evening.   Yes Historical Provider, MD   levothyroxine (SYNTHROID, LEVOTHROID) 112 MCG tablet Take 112 mcg by mouth daily before breakfast.   Yes Historical Provider, MD  lisinopril (PRINIVIL,ZESTRIL) 40 MG tablet Take 40 mg by mouth daily.   Yes Historical Provider, MD   Allergies  Allergen Reactions  . Penicillins Other (See Comments)    Childhood allergy    FAMILY HISTORY:  family history includes Heart failure in his father; Stroke in his mother. SOCIAL HISTORY:  reports that he has never smoked. He does not have any smokeless tobacco history on file. He reports that he does not drink alcohol.  REVIEW OF SYSTEMS:   Constitutional: Negative for fever, chills, weight loss, malaise/fatigue and diaphoresis.  HENT: Negative for hearing loss, ear pain, nosebleeds, + nasal congestion, sore throat, neck pain, tinnitus and ear discharge.   Eyes: Negative for blurred vision, double vision, photophobia, pain, discharge and redness.  Respiratory: non-productive cough, hemoptysis, sputum production, shortness of breath, wheezing and stridor.   Cardiovascular: Negative for chest pain, palpitations, orthopnea, claudication, leg swelling (RLE) w/ intermittent discomfort  and PND.  Gastrointestinal: Negative for heartburn, nausea, vomiting, abdominal pain, diarrhea, constipation, blood in stool and melena.  Genitourinary: Negative for dysuria, urgency, frequency, hematuria and flank pain.  Musculoskeletal: Negative for myalgias, back pain, joint pain and falls.  Skin: Negative for itching and rash.  Neurological: Negative for dizziness, tingling, tremors, sensory change, speech change, focal weakness, seizures, loss of consciousness, weakness and headaches.  Endo/Heme/Allergies: Negative for environmental allergies and polydipsia. Does not bruise/bleed easily.  SUBJECTIVE:  Feels better  VITAL SIGNS: Temp:  [97.7 F (36.5 C)-97.8 F (36.6 C)] 97.7 F (36.5 C) (03/18 0533) Pulse Rate:  [86-91] 91 (03/17 2259) Resp:  [16-20] 20 (03/18  0533) BP: (127-149)/(85-98) 127/85 mmHg (03/18 0533) SpO2:  [93 %-96 %] 96 % (03/18 0533) Weight:  [81.4 kg (179 lb 7.3 oz)-82.555 kg (182 lb)] 81.7 kg (180 lb 1.9 oz) (03/18 0534)  PHYSICAL EXAMINATION: General:  No acute distress. Breathing comfortably on room air  Neuro:  Awake, oriented. No focal def  HEENT:  [place], AT, no JVD  Cardiovascular:  rrr Lungs:  CTA Abdomen:  Soft, non-tender + bowel sounds  Musculoskeletal:  Intact good and equal strength  Skin:  RLE edema    Recent Labs Lab 08/14/14 2030  NA 141  K 4.2  CL 107  CO2 24  BUN 24*  CREATININE 1.21  GLUCOSE 106*    Recent Labs Lab 08/14/14 2030 08/15/14 0643  HGB 14.3 13.6  HCT 41.3 39.5  WBC 9.6 8.2  PLT 243 205  No results for input(s): CKTOTAL, CKMB, TROPONINI in the last 168 hours.  Dg Chest 2 View  08/14/2014   CLINICAL DATA:  Acute onset of cough and congestion. Right calf pain. Initial encounter.  EXAM: CHEST  2 VIEW  COMPARISON:  None.  FINDINGS: The lungs are well-aerated and clear. There is no evidence of focal opacification, pleural effusion or pneumothorax.  The heart is normal in size; the mediastinal contour is within normal limits. No acute osseous abnormalities are seen.  IMPRESSION: No acute cardiopulmonary process seen.   Electronically Signed   By: Garald Balding M.D.   On: 08/14/2014 21:06   Ct Angio Chest Pe W/cm &/or Wo Cm  08/14/2014   CLINICAL DATA:  Elevated D-dimer. Subacute onset of shortness of breath, with cough and congestion. History of pulmonary embolus. Initial encounter.  EXAM: CT ANGIOGRAPHY CHEST WITH CONTRAST  TECHNIQUE: Multidetector CT imaging of the chest was performed using the standard protocol during bolus administration of intravenous contrast. Multiplanar CT image reconstructions and MIPs were obtained to evaluate the vascular anatomy.  CONTRAST:  1107m OMNIPAQUE IOHEXOL 350 MG/ML SOLN  COMPARISON:  Chest radiograph performed earlier today at 9:12 p.m.  FINDINGS: There is  pulmonary embolus within both main pulmonary arteries, extending distally into segmental and subsegmental branches. This involves all lobes of both lungs. The RV/LV ratio of 1.5 corresponds to right heart strain and concern for submassive pulmonary embolus.  Minimal left lingular atelectasis or scarring is noted. The lungs are otherwise clear. There is no evidence of pleural effusion or pneumothorax. No masses are identified; no abnormal focal contrast enhancement is seen.  The mediastinum is otherwise unremarkable in appearance. No mediastinal lymphadenopathy is seen. No pericardial effusion is identified. The great vessels are grossly unremarkable in appearance. The thyroid gland is markedly diminutive and grossly unremarkable. No axillary lymphadenopathy is seen.  A 0.7 cm hypodensity within the right hepatic lobe is nonspecific in appearance. The visualized portions of the pancreas and adrenal glands are within normal limits. The gallbladder is unremarkable. A small nonspecific 5 mm node is seen adjacent to the distal esophagus, likely within normal limits.  No acute osseous abnormalities are seen.  Review of the MIP images confirms the above findings.  IMPRESSION: 1. Pulmonary embolus within both main pulmonary arteries, extending distally into segmental and subsegmental branches. This involves all lobes of both lungs. Large clot burden noted. RV/LV ratio of 1.5 corresponds to right heart strain and  concern for submassive pulmonary embolus. 2. Minimal left lingular atelectasis or scarring noted. Lungs otherwise clear. 3. Nonspecific 7 mm hypodensity within the right hepatic lobe, likely reflecting a cyst.  Critical Value/emergent results were called by telephone at the time of interpretation on 08/14/2014 at 10:12 pm to Orpah Cobb RN, who verbally acknowledged these results.   Electronically Signed   By: Garald Balding M.D.   On: 08/14/2014 22:19   US Venous Img Lower Unilateral Right  08/14/2014   CLINICAL  DATA:  Right upper calf pain for 2-3 weeks. Elevated D-dimer. Shortness of breath. Initial encounter.  EXAM: RIGHT LOWER EXTREMITY VENOUS DOPPLER ULTRASOUND  TECHNIQUE: Gray-scale sonography with graded compression, as well as color Doppler and duplex ultrasound were performed to evaluate the lower extremity deep venous systems from the level of the common femoral vein and including the common femoral, femoral, profunda femoral, popliteal and calf veins including the posterior tibial, peroneal and gastrocnemius veins when visible. The superficial great saphenous vein was also interrogated. Spectral Doppler was utilized to evaluate flow at rest and with distal augmentation maneuvers in the common femoral, femoral and popliteal veins.  COMPARISON:  None.  FINDINGS: Contralateral Common Femoral Vein: Respiratory phasicity is normal and symmetric with the symptomatic side. No evidence of thrombus. Normal compressibility.  Common Femoral Vein: No evidence of thrombus. Normal compressibility, respiratory phasicity and response to augmentation.  Saphenofemoral Junction: No evidence of thrombus. Normal compressibility and flow on color Doppler imaging.  Profunda Femoral Vein: No evidence of thrombus. Normal compressibility and flow on color Doppler imaging.  Femoral Vein: No evidence of thrombus. Normal compressibility, respiratory phasicity and response to augmentation.  Popliteal Vein: No evidence of thrombus. Normal compressibility, respiratory phasicity and response to augmentation.  Calf Veins: Occlusive thrombus is noted within the proximal posterior tibial vein, just before its junction with the popliteal vein.  Superficial Great Saphenous Vein: No evidence of thrombus. Normal compressibility and flow on color Doppler imaging.  Venous Reflux:  None.  Other Findings:  None.  IMPRESSION: Occlusive deep venous thrombosis within the proximal posterior tibial vein.  These results were called by telephone at the time of  interpretation on 08/14/2014 at 10:09 pm to Orpah Cobb RN, who verbally acknowledged these results.   Electronically Signed   By: Garald Balding M.D.   On: 08/14/2014 22:12    ASSESSMENT / PLAN:  Acute Submassive PE w/ evidence of right heart strain per CT.  RLE Proximal DVT.   Discussion  Acute submassive PE/RLE DVT. Agree w/ primary service that this was likely provoked by long travel, but given second PE; commits him to life long anticoagulation. He is hemodynamically stable. He did have small trop I rise.He is on room air. Has no pain in chest, or tachycardia. He feels better already w/ only some right LE discomfort.   Plan/rec Cont IV heparin for now F/u echo Have discussed NOAC vs warfarin again. He understands that there are currently no reversal agents for the NOACs most commonly used for PE at this point and that if major bleeding were to occur he would likely require hospitalization (minimally) and if bleeding could not be stopped he would be at risk of death. He is inclined to go with NOAC given the ease of dosing, less drug/drug interaction and less f/u lab work.  He will need graduated compression hose. Have told him he will need this for at least the next year.   Allergic rhinitis and seasonal allergies Plan Added nasal  flonase  [facility] [place] Pager: [phone]  08/15/2014, 8:24 AM  STAFF NOTE: I, Merrie Roof, MD FACP have personally reviewed patient's available data, including medical history, events of note, physical examination and test results as part of my evaluation. I have discussed with resident/NP and other care providers such as pharmacist, RN and RRT. In addition, I personally evaluated patient and elicited key findings of: No distress, clear lung fields, history supports weeks ago fo PE / dvt, risk benefit ratio does not favor ECOS, would be interested to see pa pressures and likely chronic thromboembolic dz  giving rise to chronic PA htn?, hep to noac planned, life long anticoagulation required, no filter needed, send acquired work up as PE over age 31 x 2, send PTY gene mutation only as far as congential dz risk, follow ana, esr   Lavon Paganini. Titus Mould, MD, [place] Pgr: [facility] 08/15/2014 11:00 AM

## 2014-08-15 NOTE — Care Management Note (Signed)
    Page 1 of 1   08/16/2014     12:02:29 PM CARE MANAGEMENT NOTE 08/16/2014  Patient:  Edgar Miller,Edgar Miller   Account Number:  [id number]  Date Initiated:  08/15/2014  Documentation initiated by:  Tomi Bamberger  Subjective/Objective Assessment:   dx pe/dvt  admit- from home.     Action/Plan:   Anticipated DC Date:  08/16/2014   Anticipated DC Plan:  HOME/SELF CARE      DC Planning Services  CM consult      Choice offered to / List presented to:             Status of service:  Completed, signed off Medicare Important Message given?  NO (If response is "NO", the following Medicare IM given date fields will be blank) Date Medicare IM given:   Medicare IM given by:   Date Additional Medicare IM given:   Additional Medicare IM given by:    Discharge Disposition:  HOME/SELF CARE  Per UR Regulation:  Reviewed for med. necessity/level of care/duration of stay  If discussed at [place] of Stay Meetings, dates discussed:    Comments:  08/16/14 [place], BSN [phone] patient is for dc today, NCM has given patient xarelto savings card and th pharmacy he will be going to has xarelto in stock.   08/15/14 [place], BSN [phone] patient is from home, NCM gave patient 0 co pay card for 12 months,  a benefits check was done showed patient has a deductible for 400.00 that has not been met, so co pay was going to be 270.00,  informed patient to give card to pharmacist but could not gurantee that is would be $0 co pay , he may have to pay a little or may have to pay nothing. Patient will be going to [facility] in [place], they state they have plenty of xarelto.

## 2014-08-15 NOTE — Progress Notes (Signed)
ANTICOAGULATION CONSULT NOTE - Initial Consult  Pharmacy Consult for Heparin  Indication: pulmonary embolus, DVT  Allergies  Allergen Reactions  . Penicillins     Patient Measurements: Height: 5\' 8"  (172.7 cm) Weight: 179 lb 7.3 oz (81.4 kg) IBW/kg (Calculated) : 68.4  Vital Signs: Temp: 97.8 F (36.6 C) (03/18 0039) Temp Source: Oral (03/18 0039) BP: 144/95 mmHg (03/18 0039) Pulse Rate: 91 (03/17 2259)  Labs:  Recent Labs  08/14/14 2030  HGB 14.3  HCT 41.3  PLT 243  CREATININE 1.21   Estimated Creatinine Clearance: 63.6 mL/min (by C-G formula based on Cr of 1.21).  Medical History: Past Medical History  Diagnosis Date  . Pulmonary embolism   . Hypertension   . Hypothyroidism     Assessment: 59 y/o tx from [facility] on 1500 units/hr of heparin for new onset DVT/PE. CBC good. Renal function good. Other labs as above.   Goal of Therapy:  Heparin level 0.3-0.7 units/ml Monitor platelets by anticoagulation protocol: Yes   Plan:  -Continue heparin at 1500 units/hr -HL with AM labs -Daily CBC/HL -Monitor for bleeding  [name], Xavyer Steenson 08/15/2014,1:57 AM

## 2014-08-16 ENCOUNTER — Telehealth: Payer: Self-pay | Admitting: Critical Care Medicine

## 2014-08-16 LAB — CBC
HEMATOCRIT: 36.9 % — AB (ref 39.0–52.0)
Hemoglobin: 12.7 g/dL — ABNORMAL LOW (ref 13.0–17.0)
MCH: 31.3 pg (ref 26.0–34.0)
MCHC: 34.4 g/dL (ref 30.0–36.0)
MCV: 90.9 fL (ref 78.0–100.0)
Platelets: 220 10*3/uL (ref 150–400)
RBC: 4.06 MIL/uL — ABNORMAL LOW (ref 4.22–5.81)
RDW: 11.9 % (ref 11.5–15.5)
WBC: 8.7 10*3/uL (ref 4.0–10.5)

## 2014-08-16 LAB — BASIC METABOLIC PANEL
Anion gap: 9 (ref 5–15)
BUN: 20 mg/dL (ref 6–23)
CALCIUM: 8.8 mg/dL (ref 8.4–10.5)
CO2: 19 mmol/L (ref 19–32)
Chloride: 109 mmol/L (ref 96–112)
Creatinine, Ser: 1.17 mg/dL (ref 0.50–1.35)
GFR calc Af Amer: 77 mL/min — ABNORMAL LOW (ref >90)
GFR calc non Af Amer: 67 mL/min — ABNORMAL LOW (ref >90)
Glucose, Bld: 100 mg/dL — ABNORMAL HIGH (ref 70–99)
Potassium: 4 mmol/L (ref 3.5–5.1)
Sodium: 137 mmol/L (ref 135–145)

## 2014-08-16 LAB — HEPARIN LEVEL (UNFRACTIONATED): Heparin Unfractionated: 0.44 IU/mL (ref 0.30–0.70)

## 2014-08-16 MED ORDER — RIVAROXABAN 15 MG PO TABS
15.0000 mg | ORAL_TABLET | Freq: Two times a day (BID) | ORAL | Status: DC
  Administered 2014-08-16: 15 mg via ORAL
  Filled 2014-08-16 (×3): qty 1

## 2014-08-16 MED ORDER — RIVAROXABAN (XARELTO) EDUCATION KIT FOR DVT/PE PATIENTS
PACK | Freq: Once | Status: DC
  Filled 2014-08-16: qty 1

## 2014-08-16 MED ORDER — RIVAROXABAN 20 MG PO TABS: 20.0000 mg | ORAL_TABLET | Freq: Every day | ORAL | Status: DC

## 2014-08-16 MED ORDER — RIVAROXABAN (XARELTO) VTE STARTER PACK (15 & 20 MG): ORAL_TABLET | ORAL | Status: DC

## 2014-08-16 NOTE — Telephone Encounter (Signed)
Needs posthosp appt with TP by end of this week: Thursday. I know we are closed Friday. F/u from pulm embolism Rx.  Pt will be on Xarelto 15mg  bid Then can  See me downstream f/u Pt was d/c from hosp 3/19

## 2014-08-16 NOTE — Progress Notes (Signed)
ANTICOAGULATION CONSULT NOTE - Initial Consult  Pharmacy Consult for Heparin to Xarelto  Indication: pulmonary embolus and DVT  Allergies  Allergen Reactions  . Penicillins Other (See Comments)    Childhood allergy    Patient Measurements: Height: 5\' 8"  (172.7 cm) Weight: 181 lb 3.5 oz (82.2 kg) IBW/kg (Calculated) : 68.4  Vital Signs: Temp: 98.4 F (36.9 C) (03/19 0506) Temp Source: Oral (03/19 0506) BP: 113/77 mmHg (03/19 0506) Pulse Rate: 68 (03/19 0506)  Labs:  Recent Labs  08/14/14 2030 08/15/14 0643 08/15/14 1615 08/16/14 0457  HGB 14.3 13.6  --  12.7*  HCT 41.3 39.5  --  36.9*  PLT 243 205  --  220  APTT 33 163*  --   --   LABPROT 13.9 15.8*  --   --   INR 1.07 1.24  --   --   HEPARINUNFRC  --  1.06* 0.53 0.44  CREATININE 1.21 1.16  --  1.17  TROPONINI  --  0.08*  --   --     Estimated Creatinine Clearance: 71.1 mL/min (by C-G formula based on Cr of 1.17).   Medical History: Past Medical History  Diagnosis Date  . Pulmonary embolism   . Hypertension   . Hypothyroidism     Assessment:  New onset PE/DVT, transitioning to Xarelto therapy.   Goal of Therapy:  Monitor platelets by anticoagulation protocol: Yes   Plan:  -Start Xarelto 15 mg BID with meals x 21 days, then 20 mg daily with supper -Stop heparin after first Xarelto dose is given -Minimum q72h CBC while inpatient -Monitor for bleeding  Edgar Miller, Edgar Miller 08/16/2014,7:51 AM

## 2014-08-16 NOTE — Discharge Summary (Signed)
Edgar Miller, 59 y.o., DOB 31-May-1955, MRN [id number]. Admission date: 08/14/2014 Discharge Date 08/16/2014 Primary MD PROVIDER NOT IN SYSTEM Admitting Physician Allyne Gee, MD  PCP please follow on: - CBC, BMP during next visit. - Follow on hypercoagulable workup done during hospitalization.  Admission Diagnosis  SOB (shortness of breath) [R06.02] Pulmonary emboli [I26.99] DVT (deep venous thrombosis), right [I82.401] Right calf pain [M79.661]  Discharge Diagnosis   Principal Problem:   Pulmonary emboli Active Problems:   DVT (deep venous thrombosis)   Essential hypertension   Hypothyroidism   PE (pulmonary embolism)      Past Medical History  Diagnosis Date  . Pulmonary embolism   . Hypertension   . Hypothyroidism     Past Surgical History  Procedure Laterality Date  . Heart ablation    . Ankle surgery       Hospital Course See H&P, Labs, Consult and Test reports for all details in brief, patient was admitted for **  Principal Problem:   Pulmonary emboli Active Problems:   DVT (deep venous thrombosis)   Essential hypertension   Hypothyroidism   PE (pulmonary embolism)  Edgar Miller is a 59 y.o. male with history of hypertension and hypothyroidism and previous history of pulmonary embolism 3 years ago presents to the ER with complaints of increasing shortness of breath over the last 3 weeks. Patient states over the last 3 months he has been traveling from [place] to [place] very often. Patient had originally gone to an urgent care and was referred to the ER at [place] at [facility]. In the ER patient had a CT angiogram of the chest which showed pulmonary embolism involving bilateral pulmonary arteries with Doppler showing right lower extremity DVT. On exam patient has lower extremity swelling. Patient was started on heparin drip, then was transitioned to Xarelto.   VTE/ pulmonary embolism on the right lower extremity DVT - this was likely provoked by  long travel, but given second PE; commits him to life long anticoagulation. - Initially on heparin drip due to large clot burden, then transitioned to Xarelto. - Will need lifelong anticoagulation, follow on gene mutation, ANA, ESR as an outpatient. - Pulmonary consult appreciated.  Elevated troponin - This is in the setting of submassive PE - 2-D echo showing no regional wall motion abnormalities.  Hypertension - Continue with home medication  Hypothyroidism - Continue with Synthroid  Consults  Pulmonary  Procedures CT chest angiogram: Results as below - 2-D echo showing EF 55-60%, right ventricle cavity size was moderately dilated, - Right Lower extremity DVT:  Occlusive deep venous thrombosis within the proximal posterior tibial vein  Significant Tests:  See full reports for all details    Dg Chest 2 View  08/14/2014   CLINICAL DATA:  Acute onset of cough and congestion. Right calf pain. Initial encounter.  EXAM: CHEST  2 VIEW  COMPARISON:  None.  FINDINGS: The lungs are well-aerated and clear. There is no evidence of focal opacification, pleural effusion or pneumothorax.  The heart is normal in size; the mediastinal contour is within normal limits. No acute osseous abnormalities are seen.  IMPRESSION: No acute cardiopulmonary process seen.   Electronically Signed   By: Garald Balding M.D.   On: 08/14/2014 21:06   Ct Angio Chest Pe W/cm &/or Wo Cm  08/14/2014   CLINICAL DATA:  Elevated D-dimer. Subacute onset of shortness of breath, with cough and congestion. History of pulmonary embolus. Initial encounter.  EXAM: CT ANGIOGRAPHY CHEST WITH  CONTRAST  TECHNIQUE: Multidetector CT imaging of the chest was performed using the standard protocol during bolus administration of intravenous contrast. Multiplanar CT image reconstructions and MIPs were obtained to evaluate the vascular anatomy.  CONTRAST:  117m OMNIPAQUE IOHEXOL 350 MG/ML SOLN  COMPARISON:  Chest radiograph performed earlier  today at 9:12 p.m.  FINDINGS: There is pulmonary embolus within both main pulmonary arteries, extending distally into segmental and subsegmental branches. This involves all lobes of both lungs. The RV/LV ratio of 1.5 corresponds to right heart strain and concern for submassive pulmonary embolus.  Minimal left lingular atelectasis or scarring is noted. The lungs are otherwise clear. There is no evidence of pleural effusion or pneumothorax. No masses are identified; no abnormal focal contrast enhancement is seen.  The mediastinum is otherwise unremarkable in appearance. No mediastinal lymphadenopathy is seen. No pericardial effusion is identified. The great vessels are grossly unremarkable in appearance. The thyroid gland is markedly diminutive and grossly unremarkable. No axillary lymphadenopathy is seen.  A 0.7 cm hypodensity within the right hepatic lobe is nonspecific in appearance. The visualized portions of the pancreas and adrenal glands are within normal limits. The gallbladder is unremarkable. A small nonspecific 5 mm node is seen adjacent to the distal esophagus, likely within normal limits.  No acute osseous abnormalities are seen.  Review of the MIP images confirms the above findings.  IMPRESSION: 1. Pulmonary embolus within both main pulmonary arteries, extending distally into segmental and subsegmental branches. This involves all lobes of both lungs. Large clot burden noted. RV/LV ratio of 1.5 corresponds to right heart strain and concern for submassive pulmonary embolus. 2. Minimal left lingular atelectasis or scarring noted. Lungs otherwise clear. 3. Nonspecific 7 mm hypodensity within the right hepatic lobe, likely reflecting a cyst.  Critical Value/emergent results were called by telephone at the time of interpretation on 08/14/2014 at 10:12 pm to KOrpah CobbRN, who verbally acknowledged these results.   Electronically Signed   By: JGarald BaldingM.D.   On: 08/14/2014 22:19   UKoreaVenous Img Lower  Unilateral Right  08/14/2014   CLINICAL DATA:  Right upper calf pain for 2-3 weeks. Elevated D-dimer. Shortness of breath. Initial encounter.  EXAM: RIGHT LOWER EXTREMITY VENOUS DOPPLER ULTRASOUND  TECHNIQUE: Gray-scale sonography with graded compression, as well as color Doppler and duplex ultrasound were performed to evaluate the lower extremity deep venous systems from the level of the common femoral vein and including the common femoral, femoral, profunda femoral, popliteal and calf veins including the posterior tibial, peroneal and gastrocnemius veins when visible. The superficial great saphenous vein was also interrogated. Spectral Doppler was utilized to evaluate flow at rest and with distal augmentation maneuvers in the common femoral, femoral and popliteal veins.  COMPARISON:  None.  FINDINGS: Contralateral Common Femoral Vein: Respiratory phasicity is normal and symmetric with the symptomatic side. No evidence of thrombus. Normal compressibility.  Common Femoral Vein: No evidence of thrombus. Normal compressibility, respiratory phasicity and response to augmentation.  Saphenofemoral Junction: No evidence of thrombus. Normal compressibility and flow on color Doppler imaging.  Profunda Femoral Vein: No evidence of thrombus. Normal compressibility and flow on color Doppler imaging.  Femoral Vein: No evidence of thrombus. Normal compressibility, respiratory phasicity and response to augmentation.  Popliteal Vein: No evidence of thrombus. Normal compressibility, respiratory phasicity and response to augmentation.  Calf Veins: Occlusive thrombus is noted within the proximal posterior tibial vein, just before its junction with the popliteal vein.  Superficial Great Saphenous Vein: No evidence  of thrombus. Normal compressibility and flow on color Doppler imaging.  Venous Reflux:  None.  Other Findings:  None.  IMPRESSION: Occlusive deep venous thrombosis within the proximal posterior tibial vein.  These results  were called by telephone at the time of interpretation on 08/14/2014 at 10:09 pm to Orpah Cobb RN, who verbally acknowledged these results.   Electronically Signed   By: Garald Balding M.D.   On: 08/14/2014 22:12     Today   Subjective:   Edgar Miller today has no headache,no chest abdominal pain,no new weakness tingling or numbness, feels much better wants to go home today.  Objective:   Blood pressure 113/77, pulse 68, temperature 98.4 F (36.9 C), temperature source Oral, resp. rate 12, height _0  (1.727 m), weight 82.2 kg (181 lb 3.5 oz), SpO2 97 %.  Intake/Output Summary (Last 24 hours) at 08/16/14 1406 Last data filed at 08/16/14 1023  Gross per 24 hour  Intake    540 ml  Output    800 ml  Net   -260 ml    Exam Awake Alert, Oriented *3, No new F.N deficits, Normal affect Eva.AT,PERRAL Supple Neck,No JVD, No cervical lymphadenopathy appriciated.  Symmetrical Chest wall movement, Good air movement bilaterally, CTAB RRR,No Gallops,Rubs or new Murmurs, No Parasternal Heave +ve B.Sounds, Abd Soft, Non tender, No organomegaly appriciated, No rebound -guarding or rigidity. No Cyanosis, Clubbing , mild right lower extremity edema, No new Rash or bruise  Data Review   Cultures -   CBC w Diff: Lab Results  Component Value Date   WBC 8.7 08/16/2014   HGB 12.7* 08/16/2014   HCT 36.9* 08/16/2014   PLT 220 08/16/2014   LYMPHOPCT 25 08/15/2014   MONOPCT 10 08/15/2014   EOSPCT 6* 08/15/2014   BASOPCT 1 08/15/2014   CMP: Lab Results  Component Value Date   NA 137 08/16/2014   K 4.0 08/16/2014   CL 109 08/16/2014   CO2 19 08/16/2014   BUN 20 08/16/2014   CREATININE 1.17 08/16/2014   PROT 6.3 08/15/2014   ALBUMIN 3.2* 08/15/2014   BILITOT 0.7 08/15/2014   ALKPHOS 57 08/15/2014   AST 19 08/15/2014   ALT 19 08/15/2014  .  Micro Results No results found for this or any previous visit (from the past 240 hour(s)).   Discharge Instructions      Follow-up  Information    Follow up with [facility].   Why:  You will be contacted by their office with an appointment   Contact information:   [place] [id number]       Discharge Medications     Medication List    TAKE these medications        levocetirizine 5 MG tablet  Commonly known as:  XYZAL  Take 5 mg by mouth every evening.     levothyroxine 112 MCG tablet  Commonly known as:  SYNTHROID, LEVOTHROID  Take 112 mcg by mouth daily before breakfast.     lisinopril 40 MG tablet  Commonly known as:  PRINIVIL,ZESTRIL  Take 40 mg by mouth daily.     Rivaroxaban 15 & 20 MG Tbpk  Commonly known as:  XARELTO STARTER PACK  Take as directed on package: Start with one 30m tablet by mouth twice a day with food. On Day 22, switch to one 240mtablet once a day with food.         Total Time in preparing paper work, data evaluation  and todays exam - 35 minutes  Kadir Azucena M.D on 08/16/2014 at 2:06 PM  [facility] Office  [phone]

## 2014-08-16 NOTE — Progress Notes (Signed)
Name: Edgar Miller MRN: [id number] DOB: 01/07/1956    ADMISSION DATE:  08/14/2014 CONSULTATION DATE: 3/18  REFERRING MD :  Elgergawy  CHIEF COMPLAINT:  Submassive PE  BRIEF PATIENT DESCRIPTION: 59 year old male w/ prior h/o PE. Admitted on 3/18 w/ new RLE DVT and Acute Pulmonary emboli which involved both main PAs w/ RV/LV ratio of 1.5 and c/w submassive PE). PCCM asked to eval re: potential candidate for catheter directed TPA which PCCM deemed he did NOT need.  SIGNIFICANT EVENTS    STUDIES:  CT chest 3/17: pulmonary embolus within both main pulmonary arteries, extending distally into segmental and subsegmental branches. This involves all lobes of both lungs. The RV/LV ratio of 1.5 corresponds to right heart strain and concern for submassive pulmonary embolus.Minimal left lingular atelectasis or scarring is noted 3/17 RLE DVT by ultrasound ECHO 3/18: >>   SUBJECTIVE:  Feels better   VITAL SIGNS: Temp:  [98.2 F (36.8 C)-98.8 F (37.1 C)] 98.4 F (36.9 C) (03/19 0506) Pulse Rate:  [68-93] 68 (03/19 0506) Resp:  [12-20] 12 (03/19 0506) BP: (113-148)/(73-79) 113/77 mmHg (03/19 0506) SpO2:  [95 %-97 %] 97 % (03/19 0506) Weight:  [82.2 kg (181 lb 3.5 oz)] 82.2 kg (181 lb 3.5 oz) (03/19 0506)  PHYSICAL EXAMINATION: General:  No acute distress. Breathing comfortably on room air  Neuro:  Awake, oriented. No focal def  HEENT:  [place], AT, no JVD  Cardiovascular:  rrr Lungs:  CTA Abdomen:  Soft, non-tender + bowel sounds  Musculoskeletal:  Intact good and equal strength  Skin:  RLE edema    Recent Labs Lab 08/14/14 2030 08/15/14 0643 08/16/14 0457  NA 141 140 137  K 4.2 4.2 4.0  CL 107 111 109  CO2 BUN 24* 21 20  CREATININE 1.21 1.16 1.17  GLUCOSE 106* 77 100*    Recent Labs Lab 08/14/14 2030 08/15/14 0643 08/16/14 0457  HGB 14.3 13.6 12.7*  HCT 41.3 39.5 36.9*  WBC 9.6 8.2 8.7  PLT 243 205 220    Recent Labs Lab 08/15/14 0643  TROPONINI  0.08*    Dg Chest 2 View  08/14/2014   CLINICAL DATA:  Acute onset of cough and congestion. Right calf pain. Initial encounter.  EXAM: CHEST  2 VIEW  COMPARISON:  None.  FINDINGS: The lungs are well-aerated and clear. There is no evidence of focal opacification, pleural effusion or pneumothorax.  The heart is normal in size; the mediastinal contour is within normal limits. No acute osseous abnormalities are seen.  IMPRESSION: No acute cardiopulmonary process seen.   Electronically Signed   By: Roanna Raider M.D.   On: 08/14/2014 21:06   Ct Angio Chest Pe W/cm &/or Wo Cm  08/14/2014   CLINICAL DATA:  Elevated D-dimer. Subacute onset of shortness of breath, with cough and congestion. History of pulmonary embolus. Initial encounter.  EXAM: CT ANGIOGRAPHY CHEST WITH CONTRAST  TECHNIQUE: Multidetector CT imaging of the chest was performed using the standard protocol during bolus administration of intravenous contrast. Multiplanar CT image reconstructions and MIPs were obtained to evaluate the vascular anatomy.  CONTRAST:  OMNIPAQUE IOHEXOL 350 MG/ML SOLN  COMPARISON:  Chest radiograph performed earlier today at 9:12 p.m.  FINDINGS: There is pulmonary embolus within both main pulmonary arteries, extending distally into segmental and subsegmental branches. This involves all lobes of both lungs. The RV/LV ratio of 1.5 corresponds to right heart strain and concern for submassive pulmonary embolus.  Minimal left lingular  atelectasis or scarring is noted. The lungs are otherwise clear. There is no evidence of pleural effusion or pneumothorax. No masses are identified; no abnormal focal contrast enhancement is seen.  The mediastinum is otherwise unremarkable in appearance. No mediastinal lymphadenopathy is seen. No pericardial effusion is identified. The great vessels are grossly unremarkable in appearance. The thyroid gland is markedly diminutive and grossly unremarkable. No axillary lymphadenopathy is seen.  A  0.7 cm hypodensity within the right hepatic lobe is nonspecific in appearance. The visualized portions of the pancreas and adrenal glands are within normal limits. The gallbladder is unremarkable. A small nonspecific 5 mm node is seen adjacent to the distal esophagus, likely within normal limits.  No acute osseous abnormalities are seen.  Review of the MIP images confirms the above findings.  IMPRESSION: 1. Pulmonary embolus within both main pulmonary arteries, extending distally into segmental and subsegmental branches. This involves all lobes of both lungs. Large clot burden noted. RV/LV ratio of 1.5 corresponds to right heart strain and concern for submassive pulmonary embolus. 2. Minimal left lingular atelectasis or scarring noted. Lungs otherwise clear. 3. Nonspecific 7 mm hypodensity within the right hepatic lobe, likely reflecting a cyst.  Critical Value/emergent results were called by telephone at the time of interpretation on 08/14/2014 at 10:12 pm to Mallie Snooks RN, who verbally acknowledged these results.   Electronically Signed   By: Roanna Raider M.D.   On: 08/14/2014 22:19   US Venous Img Lower Unilateral Right  08/14/2014   CLINICAL DATA:  Right upper calf pain for 2-3 weeks. Elevated D-dimer. Shortness of breath. Initial encounter.  EXAM: RIGHT LOWER EXTREMITY VENOUS DOPPLER ULTRASOUND  TECHNIQUE: Gray-scale sonography with graded compression, as well as color Doppler and duplex ultrasound were performed to evaluate the lower extremity deep venous systems from the level of the common femoral vein and including the common femoral, femoral, profunda femoral, popliteal and calf veins including the posterior tibial, peroneal and gastrocnemius veins when visible. The superficial great saphenous vein was also interrogated. Spectral Doppler was utilized to evaluate flow at rest and with distal augmentation maneuvers in the common femoral, femoral and popliteal veins.  COMPARISON:  None.  FINDINGS:  Contralateral Common Femoral Vein: Respiratory phasicity is normal and symmetric with the symptomatic side. No evidence of thrombus. Normal compressibility.  Common Femoral Vein: No evidence of thrombus. Normal compressibility, respiratory phasicity and response to augmentation.  Saphenofemoral Junction: No evidence of thrombus. Normal compressibility and flow on color Doppler imaging.  Profunda Femoral Vein: No evidence of thrombus. Normal compressibility and flow on color Doppler imaging.  Femoral Vein: No evidence of thrombus. Normal compressibility, respiratory phasicity and response to augmentation.  Popliteal Vein: No evidence of thrombus. Normal compressibility, respiratory phasicity and response to augmentation.  Calf Veins: Occlusive thrombus is noted within the proximal posterior tibial vein, just before its junction with the popliteal vein.  Superficial Great Saphenous Vein: No evidence of thrombus. Normal compressibility and flow on color Doppler imaging.  Venous Reflux:  None.  Other Findings:  None.  IMPRESSION: Occlusive deep venous thrombosis within the proximal posterior tibial vein.  These results were called by telephone at the time of interpretation on 08/14/2014 at 10:09 pm to Mallie Snooks RN, who verbally acknowledged these results.   Electronically Signed   By: Roanna Raider M.D.   On: 08/14/2014 22:12    ASSESSMENT / PLAN:  Acute Submassive PE w/ evidence of right heart strain per CT.  RLE Proximal DVT.  ECHO showed  RV strain/dilation   Discussion  Acute submassive PE/RLE DVT. Agree w/ primary service that this was likely provoked by long travel, but given second PE; commits him to life long anticoagulation. He is hemodynamically stable. He did have small trop I rise.He is on room air. Has no pain in chest, or tachycardia. He feels better already w/ only some right LE discomfort.  He is a good candidate for NOAC treatment.  Plan/rec Transition to Xarelto 15mg  bid x 21days then  20mg  daily for LIFE. I will get my office to call the pt Monday for a f/u appt with our NP or other provider on Thursday this week 08/21/14 as our office is closed on Good Friday.      Dorcas Carrowatrick WrightMD Beeper  [phone]  Cell  [phone]  If no response or cell goes to voicemail, call beeper [phone]  Pulmonary and Critical Care Medicine [facility]  08/16/2014, 9:39 AM

## 2014-08-16 NOTE — Discharge Instructions (Signed)
Information on my medicine - XARELTO (rivaroxaban)  This medication education was reviewed with me or my healthcare representative as part of my discharge preparation.    WHY WAS XARELTO PRESCRIBED FOR YOU? Xarelto was prescribed to treat blood clots that may have been found in the veins of your legs (deep vein thrombosis) or in your lungs (pulmonary embolism) and to reduce the risk of them occurring again.  What do you need to know about Xarelto? The starting dose is one 15 mg tablet taken TWICE daily with food for the FIRST 21 DAYS then on 09/06/2014  the dose is changed to one 20 mg tablet taken ONCE A DAY with your evening meal.  DO NOT stop taking Xarelto without talking to the health care provider who prescribed the medication.  Refill your prescription for 20 mg tablets before you run out.  After discharge, you should have regular check-up appointments with your healthcare provider that is prescribing your Xarelto.  In the future your dose may need to be changed if your kidney function changes by a significant amount.  What do you do if you miss a dose? If you are taking Xarelto TWICE DAILY and you miss a dose, take it as soon as you remember. You may take two 15 mg tablets (total 30 mg) at the same time then resume your regularly scheduled 15 mg twice daily the next day.  If you are taking Xarelto ONCE DAILY and you miss a dose, take it as soon as you remember on the same day then continue your regularly scheduled once daily regimen the next day. Do not take two doses of Xarelto at the same time.   Important Safety Information Xarelto is a blood thinner medicine that can cause bleeding. You should call your healthcare provider right away if you experience any of the following: ? Bleeding from an injury or your nose that does not stop. ? Unusual colored urine (red or dark brown) or unusual colored stools (red or black). ? Unusual bruising for unknown reasons. ? A serious fall  or if you hit your head (even if there is no bleeding).  Some medicines may interact with Xarelto and might increase your risk of bleeding while on Xarelto. To help avoid this, consult your healthcare provider or pharmacist prior to using any new prescription or non-prescription medications, including herbals, vitamins, non-steroidal anti-inflammatory drugs (NSAIDs) and supplements.  This website has more information on Xarelto: VisitDestination.com.brwww.xarelto.com.

## 2014-08-18 LAB — ANTINUCLEAR ANTIBODIES, IFA: ANTINUCLEAR ANTIBODIES, IFA: NEGATIVE

## 2014-08-18 NOTE — Telephone Encounter (Signed)
Called and spoke to pt. Appt made with TP on 08/19/14. Pt verbalized understanding and denied any further questions or concerns at this time.

## 2014-08-19 ENCOUNTER — Inpatient Hospital Stay: Payer: BLUE CROSS/BLUE SHIELD | Admitting: Adult Health

## 2014-08-19 LAB — PROTHROMBIN GENE MUTATION

## 2014-09-01 ENCOUNTER — Encounter: Payer: Self-pay | Admitting: Adult Health

## 2014-09-01 ENCOUNTER — Ambulatory Visit (INDEPENDENT_AMBULATORY_CARE_PROVIDER_SITE_OTHER): Payer: BLUE CROSS/BLUE SHIELD | Admitting: Adult Health

## 2014-09-01 VITALS — BP 142/102 | HR 68 | Temp 97.8°F | Ht 68.0 in | Wt 187.0 lb

## 2014-09-01 DIAGNOSIS — I82401 Acute embolism and thrombosis of unspecified deep veins of right lower extremity: Secondary | ICD-10-CM

## 2014-09-01 DIAGNOSIS — I2699 Other pulmonary embolism without acute cor pulmonale: Secondary | ICD-10-CM | POA: Diagnosis not present

## 2014-09-01 MED ORDER — RIVAROXABAN 20 MG PO TABS: 20.0000 mg | ORAL_TABLET | Freq: Every day | ORAL | Status: DC

## 2014-09-01 NOTE — Progress Notes (Signed)
   Subjective:    Patient ID: Edgar Miller, male    DOB: [birth year], 59 y.o.   MRN: [id number]  HPI 59 yo male admitted 08/14/14 for PE /RLE DVT seen by pulmonary for consult    09/01/2014 [facility] follow up  Patient presents for a post hospital follow-up Patient was admitted March 17 to March 19 for an acute pulmonary embolism and right lower extremity DVT. CT angiogram of the chest showed pulmonary embolism involving bilateral pulmonary arteries. Venous Doppler showed a positive right lower extremity DVT-occlusive DVT with the proximal posterior tibial vein.  This was patient's second PE. He had been doing extensive travel back from [place] recently.  2-D echo showed an EF of 55-60%, right ventricle moderately dilated., PAP 49  Patient was started on heparin with transition to Xarelto. ANA and prothrombin gene mutation was negative. Sedimentation rate was 18. Doing well since discharge. . Pt states breathing and right leg pain has improved but still is experincing SOB with activity. Denies any prod cough, wheezing, chest tightness/congestion, n/v/d or f/c/s or hemoptysis. No bleeding.  He is moving here from [place]. We discussed local PCP establishment.    Review of Systems Constitutional:   No  weight loss, night sweats,  Fevers, chills, fatigue, or  lassitude.  HEENT:   No headaches,  Difficulty swallowing,  Tooth/dental problems, or  Sore throat,                No sneezing, itching, ear ache, nasal congestion, post nasal drip,   CV:  No chest pain,  Orthopnea, PND, swelling in lower extremities, anasarca, dizziness, palpitations, syncope.   GI  No heartburn, indigestion, abdominal pain, nausea, vomiting, diarrhea, change in bowel habits, loss of appetite, bloody stools.   Resp:.  No excess mucus, no productive cough,  No non-productive cough,  No coughing up of blood.  No change in color of mucus.  No wheezing.  No chest wall deformity  Skin: no rash or  lesions.  GU: no dysuria, change in color of urine, no urgency or frequency.  No flank pain, no hematuria   MS:  No joint pain or swelling.  No decreased range of motion.  No back pain.  Psych:  No change in mood or affect. No depression or anxiety.  No memory loss.         Objective:   Physical Exam GEN: A/Ox3; pleasant , NAD, well nourished   HEENT:  [place]/AT,  EACs-clear, TMs-wnl, NOSE-clear, THROAT-clear, no lesions, no postnasal drip or exudate noted.   NECK:  Supple w/ fair ROM; no JVD; normal carotid impulses w/o bruits; no thyromegaly or nodules palpated; no lymphadenopathy.  RESP  Clear  P & A; w/o, wheezes/ rales/ or rhonchi.no accessory muscle use, no dullness to percussion  CARD:  RRR, no m/r/g  , no peripheral edema, pulses intact, no cyanosis or clubbing.  GI:   Soft & nt; nml bowel sounds; no organomegaly or masses detected.  Musco: Warm bil, no deformities or joint swelling noted.   Neuro: alert, no focal deficits noted.    Skin: Warm, no lesions or rashes         Assessment & Plan:

## 2014-09-01 NOTE — Patient Instructions (Addendum)
Continue on Xarelto, transition to 20mg  daily as planned.  New prescription for Xarelto 20mg  daily to pharmacy to begin on April 19.  Follow up Dr. Delton CoombesByrum  In 2 months and As needed   Establish with primary care locally.  Please contact office for sooner follow up if symptoms do not improve or worsen or seek emergency care

## 2014-09-04 NOTE — Assessment & Plan Note (Addendum)
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  Doing well on Xarelto  Would repeat 2 Echo in 3 month to make sure PAP and RA/RV dilation has returned to normal .   Plan  Continue on Xarelto, transition to 20mg  daily as planned.  New prescription for Xarelto 20mg  daily to pharmacy to begin on April 19.  Follow up Dr. Delton CoombesByrum  In 2 months and As needed   Establish with primary care locally.  Please contact office for sooner follow up if symptoms do not improve or worsen or seek emergency care

## 2014-09-04 NOTE — Assessment & Plan Note (Signed)
-   Continue on xarelto

## 2014-09-17 ENCOUNTER — Telehealth (INDEPENDENT_AMBULATORY_CARE_PROVIDER_SITE_OTHER): Payer: Self-pay | Admitting: Family Medicine

## 2014-09-17 NOTE — Telephone Encounter (Signed)
Former pt of Dr. Ashley Mariner requesting his medical records. Can you please check to see if they are with [facility]? Pt can be reached at [phone] once records are located. Thank you!

## 2014-11-27 ENCOUNTER — Encounter: Payer: Self-pay | Admitting: Sports Medicine

## 2014-11-27 ENCOUNTER — Ambulatory Visit (INDEPENDENT_AMBULATORY_CARE_PROVIDER_SITE_OTHER): Payer: BLUE CROSS/BLUE SHIELD | Admitting: Sports Medicine

## 2014-11-27 VITALS — BP 136/88 | HR 69 | Ht 68.0 in | Wt 187.0 lb

## 2014-11-27 DIAGNOSIS — M1712 Unilateral primary osteoarthritis, left knee: Secondary | ICD-10-CM

## 2014-11-27 DIAGNOSIS — H6993 Unspecified Eustachian tube disorder, bilateral: Secondary | ICD-10-CM

## 2014-11-27 DIAGNOSIS — H6990 Unspecified Eustachian tube disorder, unspecified ear: Secondary | ICD-10-CM | POA: Insufficient documentation

## 2014-11-27 DIAGNOSIS — I10 Essential (primary) hypertension: Secondary | ICD-10-CM

## 2014-11-27 DIAGNOSIS — H6980 Other specified disorders of Eustachian tube, unspecified ear: Secondary | ICD-10-CM | POA: Insufficient documentation

## 2014-11-27 DIAGNOSIS — H6983 Other specified disorders of Eustachian tube, bilateral: Secondary | ICD-10-CM | POA: Diagnosis not present

## 2014-11-27 DIAGNOSIS — R319 Hematuria, unspecified: Secondary | ICD-10-CM | POA: Diagnosis not present

## 2014-11-27 DIAGNOSIS — Z0000 Encounter for general adult medical examination without abnormal findings: Secondary | ICD-10-CM | POA: Insufficient documentation

## 2014-11-27 DIAGNOSIS — E039 Hypothyroidism, unspecified: Secondary | ICD-10-CM

## 2014-11-27 MED ORDER — GLUCOSAMINE-CHONDROITIN 500-400 MG PO TABS: 1.0000 | ORAL_TABLET | Freq: Three times a day (TID) | ORAL | Status: DC

## 2014-11-27 MED ORDER — FLUTICASONE PROPIONATE 50 MCG/ACT NA SUSP: NASAL | Status: DC

## 2014-11-27 NOTE — Assessment & Plan Note (Signed)
Up-to-date on colonoscopy, may return for a physical.

## 2014-11-27 NOTE — Assessment & Plan Note (Signed)
Urinalysis, KUB. If positive, we will look for kidney stones versus urinary system neoplasm.

## 2014-11-27 NOTE — Assessment & Plan Note (Signed)
Rechecking TSH 

## 2014-11-27 NOTE — Assessment & Plan Note (Signed)
Flonase. May use oral decongestions over-the-counter.

## 2014-11-27 NOTE — Progress Notes (Signed)
  Subjective:    CC: Establish care.   HPI:  This is a pleasant 59 year old male with a fairly, located medical history. 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  Knee arthritis: Left-sided, post meniscectomy, would like some advice, pain is moderate, persistent but better with self rehabilitation, he does have x-ray confirmed osteoarthritis from another provider. No mechanical symptoms, no swelling.  Hypothyroidism: Well-controlled  Sinus symptoms: Stuffiness in the left ear, also behind the nose, with significant itchy eyes and rhinitis.  Hematuria: Noted recently, minimal bilateral flank pain. No constitutional symptoms.  Benign essential hypertension: Well controlled on lisinopril  Past medical history, Surgical history, Family history not pertinant except as noted below, Social history, Allergies, and medications have been entered into the medical record, reviewed, and no changes needed.   Review of Systems: No headache, visual changes, nausea, vomiting, diarrhea, constipation, dizziness, abdominal pain, skin rash, fevers, chills, night sweats, swollen lymph nodes, weight loss, chest pain, body aches, joint swelling, muscle aches, shortness of breath, mood changes, visual or auditory hallucinations.  Objective:    General: Well Developed, well nourished, and in no acute distress.  Neuro: Alert and oriented x3, extra-ocular muscles intact, sensation grossly intact.  HEENT: Normocephalic, atraumatic, pupils equal round reactive to light, neck supple, no masses, no lymphadenopathy, thyroid nonpalpable. Oropharynx, nasopharynx, ear canals unremarkable Skin: Warm and dry, no rashes noted.  Cardiac: Regular rate and rhythm, no murmurs rubs or gallops.  Respiratory: Clear to auscultation bilaterally. Not using accessory muscles, speaking in full sentences.  Abdominal: Soft, nontender, nondistended,  positive bowel sounds, no masses, no organomegaly.  Left Knee: Normal to inspection with no erythema or effusion or obvious bony abnormalities. Only minimal tenderness at the medial joint line ROM normal in flexion and extension and lower leg rotation. Ligaments with solid consistent endpoints including ACL, PCL, LCL, MCL. Negative Mcmurray's and provocative meniscal tests. Non painful patellar compression. Patellar and quadriceps tendons unremarkable. Hamstring and quadriceps strength is normal.  Impression and Recommendations:    The patient was counselled, risk factors were discussed, anticipatory guidance given.

## 2014-11-27 NOTE — Assessment & Plan Note (Signed)
We are going to try a number of things. Patient has an unloader brace but does not know how to use it, we have contacted our rep get in touch with him and teach him how. Cosamin conjointly. Unable to use NSAIDs due to chronic Xarelto treatment. Return for custom orthotics. I'm also going to get him approved for [employer] and he will get x-rays for me to review. He does have x-ray confirmed osteoarthritis from another provider. We will probably do a steroidal and [employer] injection when he is approved. He will continue his home rehabilitation exercises.

## 2014-11-27 NOTE — Assessment & Plan Note (Signed)
Well-controlled on lisinopril. Checking blood work.

## 2014-12-02 ENCOUNTER — Telehealth: Payer: Self-pay | Admitting: Sports Medicine

## 2014-12-02 NOTE — Telephone Encounter (Signed)
-----   Message from Monica Bectonhomas J Thekkekandam, MD sent at 11/27/2014  4:43 PM EDT ----- XR confirmed OA, can't use NSAIDS, failed steroid injection, lets get him approved for [employer]. Preesh! -T

## 2014-12-02 NOTE — Telephone Encounter (Signed)
Information submitted to [employer] for approval.

## 2014-12-02 NOTE — Telephone Encounter (Signed)
Received fax back from [employer] stating insurance expired on 11/27/2014. Left message with Pt to clarify on Insurance coverage. Callback information provided.

## 2015-02-16 ENCOUNTER — Encounter: Payer: Self-pay | Admitting: Sports Medicine

## 2015-02-16 ENCOUNTER — Ambulatory Visit (INDEPENDENT_AMBULATORY_CARE_PROVIDER_SITE_OTHER): Payer: BLUE CROSS/BLUE SHIELD | Admitting: Sports Medicine

## 2015-02-16 VITALS — BP 156/96 | HR 64 | Wt 187.0 lb

## 2015-02-16 DIAGNOSIS — R350 Frequency of micturition: Secondary | ICD-10-CM

## 2015-02-16 DIAGNOSIS — R319 Hematuria, unspecified: Secondary | ICD-10-CM

## 2015-02-16 LAB — HEMOCCULT GUIAC POC 1CARD (OFFICE): Fecal Occult Blood, POC: NEGATIVE

## 2015-02-16 LAB — POCT URINALYSIS DIPSTICK
Bilirubin, UA: NEGATIVE
Glucose, UA: NEGATIVE
Ketones, UA: NEGATIVE
Leukocytes, UA: NEGATIVE
Nitrite, UA: NEGATIVE
Protein, UA: NEGATIVE
Spec Grav, UA: 1.02
Urobilinogen, UA: 0.2
pH, UA: 6

## 2015-02-16 MED ORDER — CIPROFLOXACIN HCL 750 MG PO TABS: 750.0000 mg | ORAL_TABLET | Freq: Two times a day (BID) | ORAL | Status: DC

## 2015-02-16 NOTE — Patient Instructions (Signed)
Use over-the-counter oral Senokot-S 2 tabs twice a day and MiraLAX 1 capful twice a day.

## 2015-02-16 NOTE — Assessment & Plan Note (Signed)
With left-sided loin to groin intermittent radiating pain. Prostate is smooth and nontender. We are going to send off for culture, as well as GC, chlamydia, considering his other symptoms this is likely nephrolithiasis. CT stone protocol, ciprofloxacin. I will add Flomax if we see a stone.

## 2015-02-16 NOTE — Progress Notes (Signed)
  Subjective:    CC: Dysuria  HPI: For the past 2 months this pleasant 59 year old male has had on and off sensation of urinary urgency, frequency, mild dysuria, with a tingling, burning sensation at the tip of the urethra, as well as mild left flank pain radiating to the left testicle, minimal blood. No history of nephrolithiasis. Symptoms are moderate, persistent without any radiation, no difficulty stooling, no GI symptoms, no constitutional symptoms.  Past medical history, Surgical history, Family history not pertinant except as noted below, Social history, Allergies, and medications have been entered into the medical record, reviewed, and no changes needed.   Review of Systems: No fevers, chills, night sweats, weight loss, chest pain, or shortness of breath.   Objective:    General: Well Developed, well nourished, and in no acute distress.  Neuro: Alert and oriented x3, extra-ocular muscles intact, sensation grossly intact.  HEENT: Normocephalic, atraumatic, pupils equal round reactive to light, neck supple, no masses, no lymphadenopathy, thyroid nonpalpable.  Skin: Warm and dry, no rashes. Cardiac: Regular rate and rhythm, no murmurs rubs or gallops, no lower extremity edema.  Respiratory: Clear to auscultation bilaterally. Not using accessory muscles, speaking in full sentences. Abdomen: Soft, nontender, not distended, normal bowel sounds, no palpable masses, no costovertebral ankle pain. Rectal: Smooth prostate that is slightly enlarged, nontender, good tone, there is a impacted piece of stool higher up in the rectum.  Hemoccults negative, clean-catch urinalysis to show some trace blood. No leukocytes. No nitrites.  First voided specimen was also obtained and will be sent off for gonorrhea/chlamydia testing.  Impression and Recommendations:    I spent 25 minutes with this patient, greater than 50% was face-to-face time counseling regarding the above diagnoses

## 2015-02-17 LAB — GC/CHLAMYDIA PROBE AMP, URINE
Chlamydia, Swab/Urine, PCR: NEGATIVE
GC Probe Amp, Urine: NEGATIVE

## 2015-02-18 LAB — CBC
HCT: 40.5 % (ref 39.0–52.0)
Hemoglobin: 13.4 g/dL (ref 13.0–17.0)
MCH: 30.9 pg (ref 26.0–34.0)
MCHC: 33.1 g/dL (ref 30.0–36.0)
MCV: 93.3 fL (ref 78.0–100.0)
MPV: 10.1 fL (ref 8.6–12.4)
Platelets: 283 10*3/uL (ref 150–400)
RBC: 4.34 MIL/uL (ref 4.22–5.81)
RDW: 13.2 % (ref 11.5–15.5)
WBC: 6 K/uL (ref 4.0–10.5)

## 2015-02-18 LAB — COMPREHENSIVE METABOLIC PANEL
BUN: 19 mg/dL (ref 7–25)
CO2: 27 mmol/L (ref 20–31)
Chloride: 105 mmol/L (ref 98–110)
Creat: 1.14 mg/dL (ref 0.70–1.33)
Glucose, Bld: 85 mg/dL (ref 65–99)
Potassium: 4.5 mmol/L (ref 3.5–5.3)

## 2015-02-18 LAB — COMPREHENSIVE METABOLIC PANEL WITH GFR
ALT: 15 U/L (ref 9–46)
AST: 15 U/L (ref 10–35)
Albumin: 3.6 g/dL (ref 3.6–5.1)
Alkaline Phosphatase: 53 U/L (ref 40–115)
Calcium: 9.3 mg/dL (ref 8.6–10.3)
Sodium: 136 mmol/L (ref 135–146)
Total Bilirubin: 0.5 mg/dL (ref 0.2–1.2)
Total Protein: 6.4 g/dL (ref 6.1–8.1)

## 2015-02-18 LAB — URINALYSIS
Bilirubin Urine: NEGATIVE
Glucose, UA: NEGATIVE
Hgb urine dipstick: NEGATIVE
Ketones, ur: NEGATIVE
Leukocytes, UA: NEGATIVE
Nitrite: NEGATIVE
Protein, ur: NEGATIVE
Specific Gravity, Urine: 1.017 (ref 1.001–1.035)
pH: 6 (ref 5.0–8.0)

## 2015-02-18 LAB — LIPID PANEL
Cholesterol: 171 mg/dL (ref 125–200)
HDL: 43 mg/dL (ref >=40)
LDL Cholesterol: 115 mg/dL (ref <130)
Total CHOL/HDL Ratio: 4 ratio (ref <=5.0)
Triglycerides: 66 mg/dL (ref <150)
VLDL: 13 mg/dL (ref <30)

## 2015-02-18 LAB — HEMOGLOBIN A1C
Hgb A1c MFr Bld: 5.7 % — ABNORMAL HIGH (ref <5.7)
Mean Plasma Glucose: 117 mg/dL — ABNORMAL HIGH (ref <117)

## 2015-02-18 LAB — TSH: TSH: 1.192 u[IU]/mL (ref 0.350–4.500)

## 2015-02-20 ENCOUNTER — Emergency Department: Payer: BLUE CROSS/BLUE SHIELD

## 2015-02-20 ENCOUNTER — Emergency Department
Admission: EM | Admit: 2015-02-20 | Discharge: 2015-02-20 | Disposition: A | Discharge status: Home / Self Care | Payer: BLUE CROSS/BLUE SHIELD | Attending: Emergency Medicine | Admitting: Emergency Medicine

## 2015-02-20 DIAGNOSIS — K219 Gastro-esophageal reflux disease without esophagitis: Secondary | ICD-10-CM | POA: Insufficient documentation

## 2015-02-20 DIAGNOSIS — N289 Disorder of kidney and ureter, unspecified: Secondary | ICD-10-CM

## 2015-02-20 DIAGNOSIS — N23 Unspecified renal colic: Secondary | ICD-10-CM

## 2015-02-20 DIAGNOSIS — N132 Hydronephrosis with renal and ureteral calculous obstruction: Secondary | ICD-10-CM | POA: Insufficient documentation

## 2015-02-20 DIAGNOSIS — R109 Unspecified abdominal pain: Secondary | ICD-10-CM

## 2015-02-20 DIAGNOSIS — Z7901 Long term (current) use of anticoagulants: Secondary | ICD-10-CM | POA: Insufficient documentation

## 2015-02-20 DIAGNOSIS — E039 Hypothyroidism, unspecified: Secondary | ICD-10-CM | POA: Insufficient documentation

## 2015-02-20 DIAGNOSIS — R312 Other microscopic hematuria: Secondary | ICD-10-CM | POA: Insufficient documentation

## 2015-02-20 DIAGNOSIS — R3129 Other microscopic hematuria: Secondary | ICD-10-CM

## 2015-02-20 DIAGNOSIS — R1032 Left lower quadrant pain: Secondary | ICD-10-CM | POA: Insufficient documentation

## 2015-02-20 DIAGNOSIS — D72829 Elevated white blood cell count, unspecified: Secondary | ICD-10-CM | POA: Insufficient documentation

## 2015-02-20 DIAGNOSIS — I10 Essential (primary) hypertension: Secondary | ICD-10-CM | POA: Insufficient documentation

## 2015-02-20 DIAGNOSIS — Z86711 Personal history of pulmonary embolism: Secondary | ICD-10-CM | POA: Insufficient documentation

## 2015-02-20 HISTORY — DX: Personal history of pulmonary embolism: Z86.711

## 2015-02-20 LAB — CBC AND DIFFERENTIAL
Basophils Absolute Automated: 0.08 10*3/uL (ref 0.00–0.20)
Basophils Automated: 1 %
Eosinophils Absolute Automated: 0.17 10*3/uL (ref 0.00–0.70)
Eosinophils Automated: 1 %
Hematocrit: 39.3 % — ABNORMAL LOW (ref 42.0–52.0)
Hgb: 13.5 g/dL (ref 13.0–17.0)
Immature Granulocytes Absolute: 0.03 10*3/uL
Immature Granulocytes: 0 %
Lymphocytes Absolute Automated: 1.33 10*3/uL (ref 0.50–4.40)
Lymphocytes Automated: 10 %
MCH: 31.4 pg (ref 28.0–32.0)
MCHC: 34.4 g/dL (ref 32.0–36.0)
MCV: 91.4 fL (ref 80.0–100.0)
MPV: 9.8 fL (ref 9.4–12.3)
Monocytes Absolute Automated: 1.13 10*3/uL (ref 0.00–1.20)
Monocytes: 8 %
Neutrophils Absolute: 11.08 10*3/uL — ABNORMAL HIGH (ref 1.80–8.10)
Neutrophils: 80 %
Platelets: 259 10*3/uL (ref 140–400)
RBC: 4.3 10*6/uL — ABNORMAL LOW (ref 4.70–6.00)
RDW: 13 % (ref 12–15)
WBC: 13.79 10*3/uL — ABNORMAL HIGH (ref 3.50–10.80)

## 2015-02-20 LAB — URINALYSIS, REFLEX TO MICROSCOPIC EXAM IF INDICATED
Bilirubin, UA: NEGATIVE
Glucose, UA: NEGATIVE
Ketones UA: NEGATIVE
Leukocyte Esterase, UA: NEGATIVE
Nitrite, UA: NEGATIVE
Protein, UR: 30 — AB
Specific Gravity UA: 1.024 (ref 1.001–1.035)
Urine pH: 6 (ref 5.0–8.0)
Urobilinogen, UA: NORMAL mg/dL

## 2015-02-20 LAB — COMPREHENSIVE METABOLIC PANEL
ALT: 17 U/L (ref 0–55)
AST (SGOT): 21 U/L (ref 5–34)
Albumin/Globulin Ratio: 1.1 (ref 0.9–2.2)
Albumin: 3.6 g/dL (ref 3.5–5.0)
Alkaline Phosphatase: 57 U/L (ref 38–106)
Anion Gap: 13 (ref 5.0–15.0)
BUN: 27.8 mg/dL (ref 9.0–28.0)
Bilirubin, Total: 0.5 mg/dL (ref 0.2–1.2)
CO2: 16 mEq/L — ABNORMAL LOW (ref 22–29)
Calcium: 9.1 mg/dL (ref 8.5–10.5)
Chloride: 108 mEq/L (ref 100–111)
Creatinine: 1.6 mg/dL — ABNORMAL HIGH (ref 0.7–1.3)
Globulin: 3.2 g/dL (ref 2.0–3.6)
Glucose: 109 mg/dL — ABNORMAL HIGH (ref 70–100)
Potassium: 4.4 mEq/L (ref 3.5–5.1)
Protein, Total: 6.8 g/dL (ref 6.0–8.3)
Sodium: 137 mEq/L (ref 136–145)

## 2015-02-20 LAB — LIPASE: Lipase: 15 U/L (ref 8–78)

## 2015-02-20 LAB — CELL MORPHOLOGY
Cell Morphology: NORMAL
Platelet Estimate: NORMAL

## 2015-02-20 LAB — GFR: EGFR: 44.3

## 2015-02-20 MED ORDER — KETOROLAC TROMETHAMINE 30 MG/ML IJ SOLN
30.0000 mg | Freq: Once | INTRAMUSCULAR | Status: AC
Start: 2015-02-20 — End: 2015-02-20
  Administered 2015-02-20: 30 mg via INTRAVENOUS
  Filled 2015-02-20: qty 1

## 2015-02-20 MED ORDER — MORPHINE SULFATE 4 MG/ML IJ/IV SOLN (WRAP)
4.0000 mg | Freq: Once | Status: AC
Start: 2015-02-20 — End: 2015-02-20
  Administered 2015-02-20: 4 mg via INTRAVENOUS
  Filled 2015-02-20: qty 1

## 2015-02-20 MED ORDER — ONDANSETRON 4 MG PO TBDP
4.0000 mg | ORAL_TABLET | Freq: Four times a day (QID) | ORAL | Status: AC | PRN
Start: 2015-02-20 — End: ?

## 2015-02-20 MED ORDER — OXYCODONE-ACETAMINOPHEN 5-325 MG PO TABS
ORAL_TABLET | ORAL | Status: DC
Start: 2015-02-20 — End: 2017-09-14

## 2015-02-20 MED ORDER — ONDANSETRON HCL 4 MG/2ML IJ SOLN
4.0000 mg | Freq: Once | INTRAMUSCULAR | Status: AC
Start: 2015-02-20 — End: 2015-02-20
  Administered 2015-02-20: 4 mg via INTRAVENOUS
  Filled 2015-02-20: qty 2

## 2015-02-20 MED ORDER — TAMSULOSIN HCL 0.4 MG PO CAPS
0.4000 mg | ORAL_CAPSULE | Freq: Every day | ORAL | Status: AC
Start: 2015-02-20 — End: 2015-02-24

## 2015-02-20 MED ORDER — TAMSULOSIN HCL 0.4 MG PO CAPS
0.4000 mg | ORAL_CAPSULE | Freq: Once | ORAL | Status: AC
Start: 2015-02-20 — End: 2015-02-20
  Administered 2015-02-20: 0.4 mg via ORAL
  Filled 2015-02-20: qty 1

## 2015-02-20 MED ORDER — ONDANSETRON 4 MG PO TBDP
4.0000 mg | ORAL_TABLET | Freq: Once | ORAL | Status: AC
Start: 2015-02-20 — End: 2015-02-20
  Administered 2015-02-20: 4 mg via ORAL
  Filled 2015-02-20: qty 1

## 2015-02-20 MED ORDER — OXYCODONE-ACETAMINOPHEN 5-325 MG PO TABS
2.0000 | ORAL_TABLET | Freq: Once | ORAL | Status: AC
Start: 2015-02-20 — End: 2015-02-20
  Administered 2015-02-20: 2 via ORAL
  Filled 2015-02-20: qty 2

## 2015-02-20 MED ORDER — SODIUM CHLORIDE 0.9 % IV BOLUS
1000.0000 mL | Freq: Once | INTRAVENOUS | Status: AC
Start: 2015-02-20 — End: 2015-02-20
  Administered 2015-02-20: 1000 mL via INTRAVENOUS

## 2015-02-20 NOTE — ED Provider Notes (Signed)
Physician/Midlevel provider first contact with patient: 02/20/15 0404         History     Chief Complaint   Patient presents with   . Flank Pain   . Abdominal Pain     HPI Comments: Left flank pain for a week that is getting worse especially since 2130- no n/v/d/c- no hematuria- mild dysuria- pain rads to llq and left groin- no n/v/d/c- saw doctor Monday and hematuria and suspected stone so abx ord - no pain meds taken- could not sleep last pm due to pain- no cp or sob- no trauma- no fever- no other complaints or similar- mild pain that self resolved 2 months ago    Patient is a 59 y.o. male presenting with flank pain. The history is provided by the patient.   Flank Pain  This is a new problem. The current episode started in the past 7 days. The problem occurs constantly. The problem has been gradually worsening. Associated symptoms include abdominal pain. Pertinent negatives include no chest pain, congestion, coughing, fever, headaches, nausea, neck pain, rash, sore throat, vomiting or weakness. Nothing aggravates the symptoms. He has tried nothing for the symptoms. The treatment provided no relief.            Past Medical History   Diagnosis Date   . Hypertension 1976     140/90 all my life, Lisinopril/HCTZ 10/12.5; s/p sleep study in 2010   . Arrhythmia 01/2011     noticed when I had a growth removed from ankle;   . Hypothyroidism 2006     Levothyroxine 0.112 mg   . 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 GERD (gastroesophageal reflux disease) 2008     occacionally at night   . Fractures 5th grade     broken arm twice, broken thumb and fingers   . Vision abnormalities 5 years ago     I wear reading glasses   . Pneumonia 2010   . MRSA cellulitis 2010     on left lower abdomen; due to bug bites became sores s/p abx   . 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 Deviated nasal septum    . Personal history of PE (pulmonary embolism)        Past Surgical History   Procedure  Laterality Date   . 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 Colonoscopy  2006     negative, rpt in 71yrs; GI in [place]   . Fracture surgery  2005     Pin in thumb, removed   . Knee cartilage surgery         Family History   Problem Relation Age of Onset   . Stroke Mother    . Gout Mother    . Heart disease Father      arrhythmia since 71s   . Kidney failure Maternal Grandfather    . Cancer Paternal Grandfather      bladder, smoker       Social  Social History   Substance Use Topics   . Smoking status: Never Smoker    . Smokeless tobacco: Never Used   . Alcohol Use: No       .     Allergies   Allergen Reactions   . Codeine Nausea And Vomiting   . Penicillins Rash       Home Medications     Last  Medication Reconciliation Action:  In Progress Roque Lias, [place] 02/20/2015  4:15 AM                  aspirin-acetaminophen-caffeine (EXCEDRIN MIGRAINE) 250-250-65 MG per tablet     Take 1 tablet by mouth every 6 (six) hours as needed. Last dose  1 week ago      levocetirizine (XYZAL) 5 MG tablet     Take 5 mg by mouth every evening.     levothyroxine (SYNTHROID, LEVOTHROID) 112 MCG tablet     Take 112 mcg by mouth daily.      lisinopril-hydrochlorothiazide (PRINZIDE,ZESTORETIC) 10-12.5 MG per tablet     Take 1 tablet by mouth daily.      Rivaroxaban (XARELTO PO)     Take by mouth.           Review of Systems   Constitutional: Negative for fever and activity change.   HENT: Negative for congestion, rhinorrhea and sore throat.    Respiratory: Negative for cough and shortness of breath.    Cardiovascular: Negative for chest pain.   Gastrointestinal: Positive for abdominal pain. Negative for nausea, vomiting, diarrhea and constipation.   Genitourinary: Positive for dysuria and flank pain. Negative for difficulty urinating and testicular pain.   Musculoskeletal: Negative for back pain and neck pain.        No trauma   Skin: Negative for color change and rash.   Neurological: Negative for syncope, weakness and headaches.    All other systems reviewed and are negative.      Physical Exam    BP: 137/81 mmHg, Heart Rate: 77, Temp: 97.5 F (36.4 C), Resp Rate: 16, SpO2: 95 %, Weight: 83.915 kg    Physical Exam   Constitutional: He is oriented to person, place, and time. Vital signs are normal. He appears well-developed and well-nourished.  Non-toxic appearance. He does not have a sickly appearance. He does not appear ill. He appears distressed.   HENT:   Head: Normocephalic and atraumatic.   Right Ear: External ear normal.   Left Ear: External ear normal.   Nose: Nose normal.   Mouth/Throat: Oropharynx is clear and moist and mucous membranes are normal.   Eyes: EOM and lids are normal. Pupils are equal, round, and reactive to light.   Neck: Trachea normal and full passive range of motion without pain. No spinous process tenderness present.   Cardiovascular: Normal rate, regular rhythm, normal heart sounds and normal pulses.    Pulmonary/Chest: Effort normal and breath sounds normal.   Abdominal: Soft. Bowel sounds are normal. There is no tenderness. There is no rebound and no guarding.       Genitourinary: Right testis shows no swelling and no tenderness. Left testis shows no swelling and no tenderness. Circumcised. No penile erythema or penile tenderness.         Musculoskeletal: Normal range of motion.        Thoracic back: He exhibits normal range of motion, no tenderness, no bony tenderness and no pain.        Lumbar back: He exhibits pain. He exhibits normal range of motion, no tenderness and no bony tenderness.        Back:    nt ext with full rom and nvi   Lymphadenopathy:     He has no cervical adenopathy.   Neurological: He is alert and oriented to person, place, and time. He has normal strength. No cranial nerve deficit or sensory deficit. GCS eye  subscore is 4. GCS verbal subscore is 5. GCS motor subscore is 6.   Skin: Skin is warm, dry and intact. No rash noted.   Psychiatric: He has a normal mood and affect.   Nursing note  and vitals reviewed.        MDM and ED Course     ED Medication Orders     Start Ordered     Status Ordering Provider    02/20/15 0715 02/20/15 0714  oxyCODONE-acetaminophen (PERCOCET) 5-325 MG per tablet 2 tablet   Once     Route: Oral  Ordered Dose: 2 tablet     Ordered Abbrielle Batts    02/20/15 0715 02/20/15 0714  ondansetron (ZOFRAN-ODT) disintegrating tablet 4 mg   Once     Route: Oral  Ordered Dose: 4 mg     Ordered Teressa Mcglocklin    02/20/15 0707 02/20/15 0706  tamsulosin (FLOMAX) capsule 0.4 mg   Once     Route: Oral  Ordered Dose: 0.4 mg     Acknowledged Paris Chiriboga    02/20/15 0706 02/20/15 0706  ketorolac (TORADOL) injection 30 mg   Once     Route: Intravenous  Ordered Dose: 30 mg     Acknowledged Elley Harp    02/20/15 0501 02/20/15 0500  morphine injection 4 mg   Once     Route: Intravenous  Ordered Dose: 4 mg     Last MAR action:  Given Adrain Nesbit    02/20/15 0501 02/20/15 0500  ondansetron (ZOFRAN) injection 4 mg   Once     Route: Intravenous  Ordered Dose: 4 mg     Last MAR action:  Given Tyeson Tanimoto    02/20/15 0406 02/20/15 0405  sodium chloride 0.9 % bolus 1,000 mL   Once     Route: Intravenous  Ordered Dose: 1,000 mL     Last MAR action:  Stopped Lorrena Goranson             MDM  Number of Diagnoses or Management Options  Acute left flank pain:   Leukocytosis, unspecified type:   Microscopic hematuria:   Renal colic on left side:   Renal insufficiency:   Diagnosis management comments: Left flank pain with rads to groin- renal colic vs other- w/u, support, dispo pending    I, Michaelle Copas, MD, have been the primary provider for [name] during this Emergency Dept visit.    Oxygen saturation by pulse oximetry is 95%-100%, Normal.  Interventions: None Needed    Ct pending, support and dispo pending    All dx on list d/w pt with renal stone etiology - f/u pcp and urology- pt did not want admit today- support    Labs Reviewed  CBC AND DIFFERENTIAL -  Abnormal; Notable for the following:      WBC                           13.79 (*)               Hematocrit                    39.3 (*)               RBC                           4.30 (*)  Neutrophils Absolute          11.08 (*)            All other components within normal limits  COMPREHENSIVE METABOLIC PANEL - Abnormal; Notable for the following:      Glucose                       109 (*)                Creatinine                    1.6 (*)                CO2                           16 (*)              All other components within normal limits  URINALYSIS, REFLEX TO MICROSCOPIC EXAM IF INDICATED - Abnormal; Notable for the following:      Protein, UR                   30 (*)                 Blood, UA                     Large (*)               RBC, UA                       TNTC (*)            All other components within normal limits  LIPASE  GFR  CELL MORPHOLOGY    Radiology Results (24 Hour)     Procedure Component Value Units Date/Time    CT Abd/ Pelvis without Contrast ([id number]) Collected:  02/20/15 0651    Order Status:  Completed Updated:  02/20/15 0708    Narrative:      INDICATION: flank pain. 59 year old male with history of hypertension,  arrhythmia, pulmonary embolism. Left flank pain.  No history of renal  calculi.                COMPARISON:  No prior CT abdomen for comparison. CT chest 08/07/2011      TECHNIQUE: CT ABDOMEN PELVIS WO IV/ WO PO CONT: Helical imaging was  performed on a multislice CT scanner without IV or oral contrast. Axial  scans were obtained from the xiphoid through the symphysis pubis.       Reconstructions: MPR Coronal and sagittal. The following dose reduction  techniques were utilized: automated exposure control and/or adjustment  of the mA and/or kV according to patient's size, and the use of  iterative reconstruction technique.     FINDINGS:      No intrarenal calculi are seen.     The right kidney  shows no hydronephrosis.  The left kidney shows mild  hydronephrosis.      The right ureter is nondilated.      There is moderate left perinephric  stranding. No drainable perinephric fluid collection is seen. A 5 mm  left ureterovesical junction calculus is visible on axial image 73/92.  This ureteral calculus is not visible on the scout view. No other  definite ureteral calculi are seen.  The urinary bladder shows  no calculi and is grossly unremarkable.  However, the bladder is not  well evaluated because of poor distention.      The prostate gland is  not enlarged. No significant sidewall adenopathy or pelvic mass is seen.          The lung bases show no acute infiltrates.           The liver, spleen,  pancreas, adrenal glands, and kidneys show no apparent masses.     Note  the limitations to solid organ evaluation because of the lack of IV  contrast.           There is no CT evidence of pancreatitis.     The gallbladder and bile  ducts are unremarkable.      The portal vein and other vascular  structures cannot be evaluated due to the lack of IV contrast.           The aorta is nonaneurysmal.      There is no retroperitoneal adenopathy,  hemorrhage or mass.     No significant bowel abnormalities are seen.  There is no bowel dilatation or definite wall thickening. There is no CT  evidence of colitis or diverticulitis.     No omental or mesenteric  abnormality is seen.               There is no intraperitoneal free air  or ascites.         The abdominal wall soft tissues are unremarkable.          No significant bony abnormalities are identified.  Spinal  degenerative changes are noted.                  Impression:        CT of the abdomen and pelvis without contrast shows an  obstructing 5 mm left ureterovesical junction calculus causing mild  hydronephrosis with perinephric stranding.     No other renal or  ureteral calculi are seen.                              Miguel Dibble, MD   02/20/2015 7:04 AM               Amount and/or Complexity of Data  Reviewed  Clinical lab tests: ordered and reviewed  Tests in the radiology section of CPT: reviewed and ordered              Procedures    Clinical Impression & Disposition     Clinical Impression  Final diagnoses:   Leukocytosis, unspecified type   Renal insufficiency   Microscopic hematuria   Acute left flank pain   Renal colic on left side        ED Disposition     Discharge [name] discharge to home/self care.    Condition at disposition: Stable             New Prescriptions    ONDANSETRON (ZOFRAN ODT) 4 MG DISINTEGRATING TABLET    Take 1 tablet (4 mg total) by mouth every 6 (six) hours as needed.    OXYCODONE-ACETAMINOPHEN (PERCOCET) 5-325 MG PER TABLET    1-2 tablets by mouth every 4-6 hours as needed for pain;  Do not drive or operate machinery while taking this medicine    TAMSULOSIN (FLOMAX) 0.4 MG CAP    Take 1 capsule (0.4 mg  total) by mouth daily. Discontinue for lightheadedness                   Michaelle Copas, MD  02/20/15 [phone]

## 2015-02-20 NOTE — Discharge Instructions (Signed)
Kidney Stones    You have been seen for a kidney stone.    A kidney stone is a hard mineral and crystalline material (a lot like gravel). It forms inside the kidney or the urinary tract. When it moves from the kidney into the tube between the kidney and the bladder (the ureter), it causes severe pain. Kidney stones form when there is less urine (pee) or too many stone-forming substances in the urine. Normally, kidney stones are made of calcium oxalate or calcium phosphate. Kidney stones associated with infection in the urinary tract are called struvite or infection stones.    Symptoms include sharp pain in the side that may radiate (spread) to the groin. Nausea and vomiting are also common. A doctor diagnosed your kidney stone based on your exam, urine test, and medical history. The doctor may have run a special test called a helical CT stone study or an intravenous pyelogram (IVP).    Men get kidney stones more often than women. Whites get them more often than African-Americans. Kidney stones become more common when men reach their 40s. The risk gets higher with age. People who have already had more than one kidney stone often get more stones.    There are different conditions that can cause kidney stones: Hypercalcuria is an inherited (genetic) condition that causes high calcium in the urine. This causes stones in more than half of cases. There are other conditions that cause an increased risk of kidney stones. These include gout (a joint condition), hyperparathyroidism (a hormone condition), inflammatory bowel disease (Crohn's disease and Ulcerative colitis) and intestinal bypass surgery. They also include kidney diseases like renal tubular acidosis. Certain medicines also increase the risk of kidney stones. These include some diuretics (water pills), antacids with calcium, and the HIV drug indinavir (Crixivan).    Most kidney stones pass on their own. Larger stones or stones that don't pass in a few days  may need to be taken out. This is done by a urologist (a doctor who specializes in the urinary tract). Kidney stones are normally treated with pain and nausea medicine.    You should drink lots of fluid--up to 8 glasses of water a day. You should follow up with a urologist in the next 2-3 weeks. Strain all of your urine so you can catch the kidney stone as it passes out of the bladder. Keep the stone and bring it with you to your urology appointment. The urologist may have the stone tested to find out what it is made of. This may help the urologist recommend diet changes. He or she may also suggest medicines to help prevent more kidney stones.    YOU SHOULD SEEK MEDICAL ATTENTION IMMEDIATELY, EITHER HERE OR AT THE NEAREST EMERGENCY DEPARTMENT, IF ANY OF THE FOLLOWING OCCURS:   The pain gets worse or the medicine isn't enough to treat your pain.   You get sick (nausea) or vomit and can t keep down fluids or pain medicine.   You have a fever (temperature higher than 100.4F / 38C) or shaking chills.            Over the counter Ibuprofen for pain with Percocet for more severe pain.

## 2015-02-20 NOTE — ED Notes (Signed)
Pt c/o left lower abdominal pain with some radiation in the back, pt states he is also having burning urge to pee but can't. Pt was seen in [place] yesterday and was told he had blood in his urine and was told he might have a kidney stone. Pain 7/10

## 2015-03-02 ENCOUNTER — Ambulatory Visit: Payer: BLUE CROSS/BLUE SHIELD | Admitting: Sports Medicine

## 2015-03-06 ENCOUNTER — Ambulatory Visit (HOSPITAL_COMMUNITY)
Admission: RE | Admit: 2015-03-06 | Discharge: 2015-03-06 | Disposition: A | Discharge status: Home / Self Care | Payer: BLUE CROSS/BLUE SHIELD | Source: Ambulatory Visit | Attending: Adult Health | Admitting: Adult Health

## 2015-03-06 ENCOUNTER — Encounter: Payer: Self-pay | Admitting: Adult Health

## 2015-03-06 ENCOUNTER — Encounter (HOSPITAL_COMMUNITY): Payer: Self-pay

## 2015-03-06 ENCOUNTER — Ambulatory Visit (INDEPENDENT_AMBULATORY_CARE_PROVIDER_SITE_OTHER): Payer: BLUE CROSS/BLUE SHIELD | Admitting: Adult Health

## 2015-03-06 VITALS — BP 124/72 | HR 60 | Temp 97.9°F | Ht 68.0 in | Wt 187.0 lb

## 2015-03-06 DIAGNOSIS — I2699 Other pulmonary embolism without acute cor pulmonale: Secondary | ICD-10-CM

## 2015-03-06 DIAGNOSIS — Z86711 Personal history of pulmonary embolism: Secondary | ICD-10-CM | POA: Diagnosis not present

## 2015-03-06 DIAGNOSIS — R042 Hemoptysis: Secondary | ICD-10-CM | POA: Insufficient documentation

## 2015-03-06 DIAGNOSIS — I82401 Acute embolism and thrombosis of unspecified deep veins of right lower extremity: Secondary | ICD-10-CM

## 2015-03-06 MED ORDER — IOHEXOL 350 MG/ML SOLN
80.0000 mL | Freq: Once | INTRAVENOUS | Status: AC | PRN
  Administered 2015-03-06: 80 mL via INTRAVENOUS

## 2015-03-06 NOTE — Progress Notes (Signed)
Subjective:    Patient ID: Edgar Miller, male    DOB: 1956-03-19, 59 y.o.   MRN: [id number]  HPI 59 yo male admitted 08/14/14 for PE /RLE DVT seen by pulmonary for consult    03/06/2015 Follow up : PE/RLE DVT  Pt returns for follow up .  Was admitted March 17 to March 19 for an acute pulmonary embolism and right lower extremity DVT. CT angiogram of the chest showed pulmonary embolism involving bilateral pulmonary arteries. Venous Doppler showed a positive right lower extremity DVT-occlusive DVT with the proximal posterior tibial vein.  This was patient's second PE. He had extensive travel back from [place] recently.  2-D echo showed an EF of 55-60%, right ventricle moderately dilated., PAP 49  Patient was started on heparin with transition to Xarelto. He remains on Xarelto w/ only ~2 doses missed since last seen in office . Noticed 2 weeks ago, bright red blood mixed in mucus while traveling from [place] to [place].  He is moving here from [place]. Has been traveling back and forth for last 6 months. Will be comlpletely moved in 6 months.  He did not have chest pain, dyspnea or calf pain or swelling.  Blood mucus was scant and only last for 3 days and stopped with no further appearance.  He did not keep follow up with Dr. Delton Coombes  Due to moving /job issues.  He is suppose to have repeat echo due to right sided heart strain with elevated PAP with initial dx of acute PE.  He denies hematuria, bloody stools , visual/speech changes, or known injury .     Review of Systems Constitutional:   No  weight loss, night sweats,  Fevers, chills, fatigue, or  lassitude.  HEENT:   No headaches,  Difficulty swallowing,  Tooth/dental problems, or  Sore throat,                No sneezing, itching, ear ache, nasal congestion, post nasal drip,   CV:  No chest pain,  Orthopnea, PND, swelling in lower extremities, anasarca, dizziness, palpitations, syncope.   GI  No heartburn, indigestion, abdominal pain,  nausea, vomiting, diarrhea, change in bowel habits, loss of appetite, bloody stools.   Resp:.  No excess mucus, no productive cough,  No non-productive cough,  No coughing up of blood.  No change in color of mucus.  No wheezing.  No chest wall deformity  Skin: no rash or lesions.  GU: no dysuria, change in color of urine, no urgency or frequency.  No flank pain, no hematuria   MS:  No joint pain or swelling.  No decreased range of motion.  No back pain.  Psych:  No change in mood or affect. No depression or anxiety.  No memory loss.         Objective:   Physical Exam GEN: A/Ox3; pleasant , NAD, well nourished   HEENT:  [place]/AT,  EACs-clear, TMs-wnl, NOSE-clear, THROAT-clear, no lesions, no postnasal drip or exudate noted.   NECK:  Supple w/ fair ROM; no JVD; normal carotid impulses w/o bruits; no thyromegaly or nodules palpated; no lymphadenopathy.  RESP  Clear  P & A; w/o, wheezes/ rales/ or rhonchi.no accessory muscle use, no dullness to percussion  CARD:  RRR, no m/r/g  , no peripheral edema, pulses intact, no cyanosis or clubbing. Neg homans sign or calf tenderness.   GI:   Soft & nt; nml bowel sounds; no organomegaly or masses detected.  Musco: Warm bil, no  deformities or joint swelling noted.   Neuro: alert, no focal deficits noted.    Skin: Warm, no lesions or rashes         Assessment & Plan:

## 2015-03-06 NOTE — Patient Instructions (Signed)
We are setting you up for a CT chest .  We are setting you up for  2 D echo and venous doppler (hx of PE)  Continue on Xarelto .  Call back if you cough up any more blood .  follow up Dr. Delton Coombes  In 4 weeks and As needed   Please contact office for sooner follow up if symptoms do not improve or worsen or seek emergency care

## 2015-03-11 ENCOUNTER — Other Ambulatory Visit: Payer: Self-pay | Admitting: Adult Health

## 2015-03-11 NOTE — Assessment & Plan Note (Signed)
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 Had minimal breaks in therapy but with hx  Along with hemoptyiss will repeat CT chest to make sure no PE.   Needs follow up echo and will check venous dopplers.   Plan  We are setting you up for a CT chest .  We are setting you up for  2 D echo and venous doppler (hx of PE)  Continue on Xarelto .  Call back if you cough up any more blood .  follow up Dr. Delton CoombesByrum  In 4 weeks and As needed   Please contact office for sooner follow up if symptoms do not improve or worsen or seek emergency care

## 2015-03-11 NOTE — Assessment & Plan Note (Signed)
Check venous dopplers

## 2015-03-12 ENCOUNTER — Ambulatory Visit (HOSPITAL_COMMUNITY): Admission: RE | Admit: 2015-03-12 | Payer: BLUE CROSS/BLUE SHIELD | Source: Ambulatory Visit

## 2015-03-12 ENCOUNTER — Ambulatory Visit (HOSPITAL_COMMUNITY)
Admission: RE | Admit: 2015-03-12 | Discharge: 2015-03-12 | Disposition: A | Discharge status: Home / Self Care | Payer: BLUE CROSS/BLUE SHIELD | Source: Ambulatory Visit | Attending: Adult Health | Admitting: Adult Health

## 2015-03-12 DIAGNOSIS — I071 Rheumatic tricuspid insufficiency: Secondary | ICD-10-CM | POA: Insufficient documentation

## 2015-03-12 DIAGNOSIS — I2699 Other pulmonary embolism without acute cor pulmonale: Secondary | ICD-10-CM | POA: Insufficient documentation

## 2015-03-12 NOTE — Progress Notes (Signed)
  Echocardiogram 2D Echocardiogram has been performed.  Edgar Miller, Edgar Miller 03/12/2015, 4:06 PM

## 2015-03-17 NOTE — Progress Notes (Signed)
Quick Note:  Called and spoke with pt. Reviewed results and recs. Pt voiced understanding and had no further questions. Copy of results have been faxed to PCP ______

## 2015-04-14 ENCOUNTER — Ambulatory Visit: Payer: BLUE CROSS/BLUE SHIELD | Admitting: Emergency Medicine

## 2015-08-25 ENCOUNTER — Other Ambulatory Visit: Payer: Self-pay | Admitting: Adult Health

## 2015-10-01 ENCOUNTER — Other Ambulatory Visit: Payer: Self-pay | Admitting: Adult Health

## 2015-10-01 NOTE — Telephone Encounter (Signed)
LVM for pt to return call

## 2015-10-02 ENCOUNTER — Telehealth: Payer: Self-pay

## 2015-10-02 MED ORDER — LEVOTHYROXINE SODIUM 112 MCG PO TABS: 112.0000 ug | ORAL_TABLET | Freq: Every day | ORAL | Status: DC

## 2015-10-02 NOTE — Telephone Encounter (Signed)
Done

## 2015-10-02 NOTE — Telephone Encounter (Signed)
Spoke with pt. States that he needs a refill on Levothyroxine. Advised him that we could not refill this for him, he would need to contact his PCP. Nothing further was needed.

## 2015-10-02 NOTE — Telephone Encounter (Signed)
Patient returned call, CB is [phone]

## 2015-10-02 NOTE — Telephone Encounter (Signed)
Pt would like to know if you would be willing to fill his levothyroxine. Please advise.

## 2015-11-23 ENCOUNTER — Other Ambulatory Visit: Payer: Self-pay | Admitting: Adult Health

## 2015-12-10 ENCOUNTER — Other Ambulatory Visit: Payer: Self-pay | Admitting: Sports Medicine

## 2016-01-19 ENCOUNTER — Other Ambulatory Visit: Payer: Self-pay | Admitting: Adult Health

## 2016-01-21 ENCOUNTER — Other Ambulatory Visit: Payer: Self-pay | Admitting: Adult Health

## 2016-01-25 ENCOUNTER — Telehealth: Payer: Self-pay

## 2016-01-25 MED ORDER — RIVAROXABAN 20 MG PO TABS: ORAL_TABLET | ORAL | 2 refills | Status: DC

## 2016-01-25 NOTE — Telephone Encounter (Signed)
Called and spoke with pt and he has scheduled an appt with TP on 9/22 at 430.  Pt is aware of appt date and time and he is aware that refills of the xarelto has been sent to his pharmacy.

## 2016-02-19 ENCOUNTER — Ambulatory Visit (INDEPENDENT_AMBULATORY_CARE_PROVIDER_SITE_OTHER): Payer: 59 | Admitting: Adult Health

## 2016-02-19 ENCOUNTER — Encounter: Payer: Self-pay | Admitting: Adult Health

## 2016-02-19 DIAGNOSIS — I2699 Other pulmonary embolism without acute cor pulmonale: Secondary | ICD-10-CM | POA: Diagnosis not present

## 2016-02-19 MED ORDER — RIVAROXABAN 20 MG PO TABS: ORAL_TABLET | ORAL | 5 refills | Status: DC

## 2016-02-19 NOTE — Assessment & Plan Note (Signed)
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  Continue on Xarelto .  follow up Dr. Delton CoombesByrum  In  6 months and As needed   Please contact office for sooner follow up if symptoms do not improve or worsen or seek emergency care

## 2016-02-19 NOTE — Patient Instructions (Signed)
Avoid NSAIDS (advil, ibuprofen, aleve,naproxen , excedrin, etc)  These can cause bleeding.  Continue on Xarelto .  follow up Dr. Delton CoombesByrum  In  6 months and As needed   Please contact office for sooner follow up if symptoms do not improve or worsen or seek emergency care

## 2016-02-19 NOTE — Progress Notes (Signed)
Subjective:    Patient ID: Edgar Miller, male    DOB: [birth year], 60 y.o.   MRN: [id number]  HPI 60  yo male admitted 08/14/14 for PE /RLE DVT seen by pulmonary for consult   TEST Nadeen Landau  Was admitted March 17 to August 16 2014 for an acute pulmonary embolism and right lower extremity DVT. CT angiogram of the chest showed pulmonary embolism involving bilateral pulmonary arteries. Venous Doppler showed a positive right lower extremity DVT-occlusive DVT with the proximal posterior tibial vein.  This was patient's second PE. He had extensive travel back from [place] recently.  2-D echo showed an EF of 55-60%, right ventricle moderately dilated., PAP 49  CT chest 02/2015 neg PE  Echo 02/2015 EF nml , PAP nml    02/19/2016 Follow up : PE/RLE DVT  Pt returns for 1 year follow up for recurrent PE on Xarelto.  .Was suppose to see [place] back in November 2016 but did not return  Says he has been doing well . Has now moved here completely . Was traveling a lot b/t here and [place] .  Much less travel now.  Last ov was set up for repeat echo that showed nml EF and PAP pressures return to normal  He has some hemoptysis briefly but resolved on its on . CT chest was neg for PE.  He did not have chest pain, dyspnea or calf pain or swelling. Or bleeding .  We discussed NSAID use as he uses excederin on occasion for headaches.    Past Medical History:  Diagnosis Date  . Collagen vascular disease (HCC)   . Hypertension   . Hypothyroidism   . Pulmonary embolism (HCC)    Current Outpatient Prescriptions on File Prior to Visit  Medication Sig Dispense Refill  . levocetirizine (XYZAL) 5 MG tablet TAKE 1 TABLET BY MOUTH EVERY DAY 90 tablet 1  . levothyroxine (SYNTHROID, LEVOTHROID) 112 MCG tablet Take 1 tablet (112 mcg total) by mouth daily before breakfast. 90 tablet 3  . lisinopril (PRINIVIL,ZESTRIL) 20 MG tablet TAKE 1 TABLET BY MOUTH EVERY DAY. 90 tablet 0  . lisinopril (PRINIVIL,ZESTRIL) 40 MG  tablet Take 40 mg by mouth daily.    . fluticasone (FLONASE) 50 MCG/ACT nasal spray One spray in each nostril twice a day, use left hand for right nostril, and right hand for left nostril. (Patient not taking: Reported on 02/19/2016) 48 g 3  . glucosamine-chondroitin 500-400 MG tablet Take 1 tablet by mouth 3 (three) times daily. (Patient not taking: Reported on 02/19/2016) 270 tablet 3  . oxyCODONE-acetaminophen (PERCOCET) 5-325 MG tablet Take 1 tablet by mouth every 6 (six) hours as needed.     No current facility-administered medications on file prior to visit.       Review of Systems Constitutional:   No  weight loss, night sweats,  Fevers, chills, fatigue, or  lassitude.  HEENT:   No headaches,  Difficulty swallowing,  Tooth/dental problems, or  Sore throat,                No sneezing, itching, ear ache, nasal congestion, post nasal drip,   CV:  No chest pain,  Orthopnea, PND, swelling in lower extremities, anasarca, dizziness, palpitations, syncope.   GI  No heartburn, indigestion, abdominal pain, nausea, vomiting, diarrhea, change in bowel habits, loss of appetite, bloody stools.   Resp:.  No excess mucus, no productive cough,  No non-productive cough,  No coughing up of blood.  No change in  color of mucus.  No wheezing.  No chest wall deformity  Skin: no rash or lesions.  GU: no dysuria, change in color of urine, no urgency or frequency.  No flank pain, no hematuria   MS:  No joint pain or swelling.  No decreased range of motion.  No back pain.  Psych:  No change in mood or affect. No depression or anxiety.  No memory loss.         Objective:   Physical Exam  Vitals:   02/19/16 1648  BP: 122/86  Pulse: 68  Temp: 97.5 F (36.4 C)  TempSrc: Oral  SpO2: 95%  Weight: 190 lb 3.2 oz (86.3 kg)  Height: 5\' 8"  (1.727 m)    GEN: A/Ox3; pleasant , NAD, well nourished    HEENT:  [place]/AT,  EACs-clear, TMs-wnl, NOSE-clear, THROAT-clear, no lesions, no postnasal drip or  exudate noted.   NECK:  Supple w/ fair ROM; no JVD; normal carotid impulses w/o bruits; no thyromegaly or nodules palpated; no lymphadenopathy.    RESP  Clear  P & A; w/o, wheezes/ rales/ or rhonchi. no accessory muscle use, no dullness to percussion  CARD:  RRR, no m/r/g  , no peripheral edema, pulses intact, no cyanosis or clubbing. Neg homans sign or calf tenderness.   GI:   Soft & nt; nml bowel sounds; no organomegaly or masses detected.   Musco: Warm bil, no deformities or joint swelling noted.   Neuro: alert, no focal deficits noted.    Skin: Warm, no lesions or rashes   Isaah Furry NP-C  [facility]  02/19/2016

## 2016-03-20 ENCOUNTER — Other Ambulatory Visit: Payer: Self-pay | Admitting: Sports Medicine

## 2016-04-07 IMAGING — CT CT ANGIO CHEST
2 of 6 series · 18 of 36 positions shown · IV contrast (omnipaque)
Comparison: Chest radiograph performed earlier today at [DATE] p.m.

CLINICAL DATA: Elevated D-dimer. Subacute onset of shortness of
breath, with cough and congestion. History of pulmonary embolus.
Initial encounter.

EXAM:
CT ANGIOGRAPHY CHEST WITH CONTRAST
TECHNIQUE: Multidetector CT imaging of the chest was performed using the
standard protocol during bolus administration of intravenous
contrast. Multiplanar CT image reconstructions and MIPs were
obtained to evaluate the vascular anatomy.
CONTRAST:  100mL OMNIPAQUE IOHEXOL 350 MG/ML SOLN

[Series 5: pe 1.0 b26f · axial · 0.67mm/px · z∈[+1061,+1313]mm · 17 of 281 slices shown]
[im 15/281  lung]
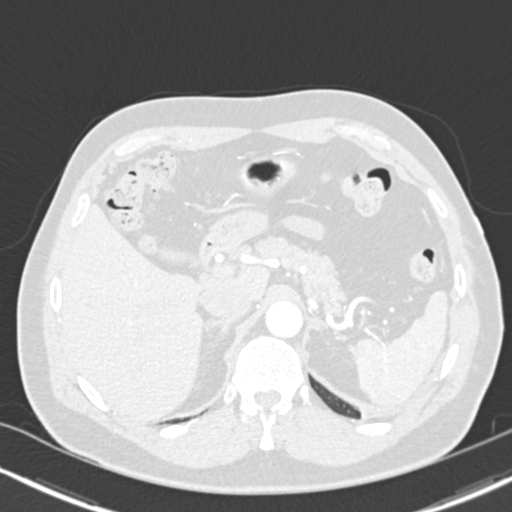
[im 29/281  mediastinal]
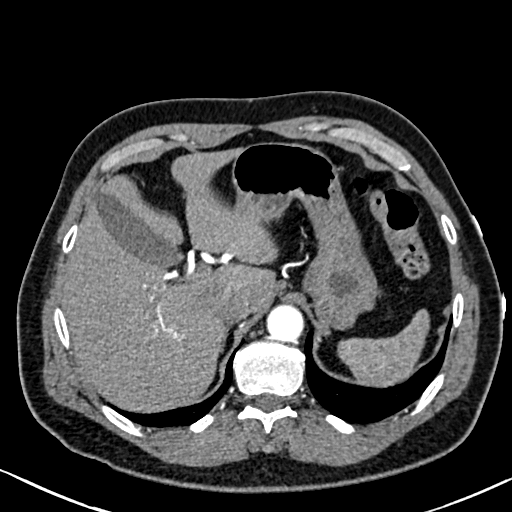
[im 43/281  lung]
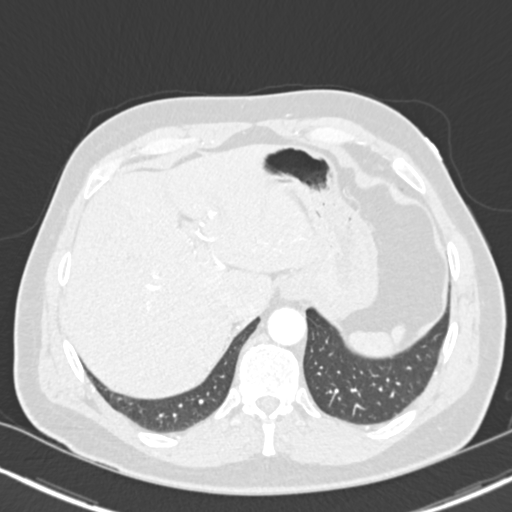
[im 57/281  mediastinal]
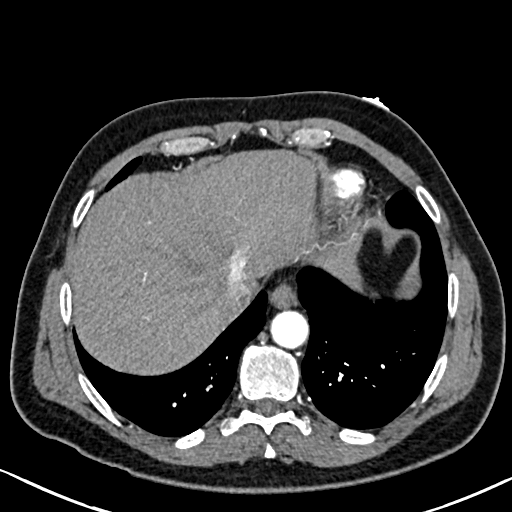
[im 85/281  lung]
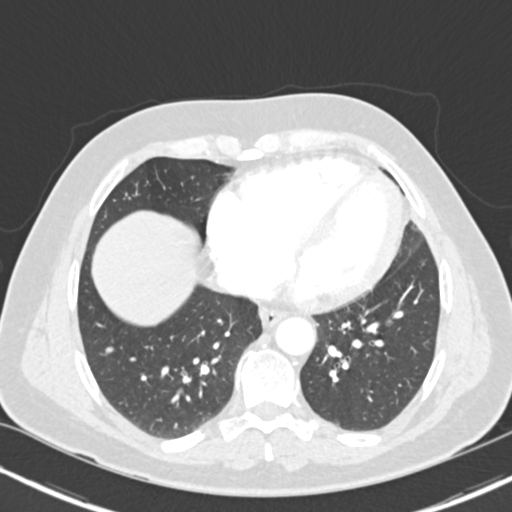
[im 99/281  mediastinal]
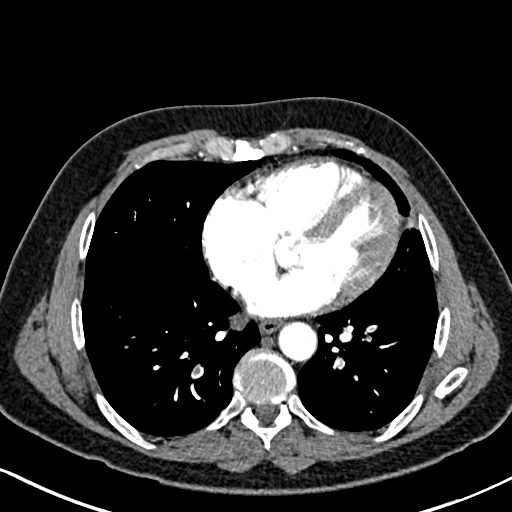
[im 113/281  lung]
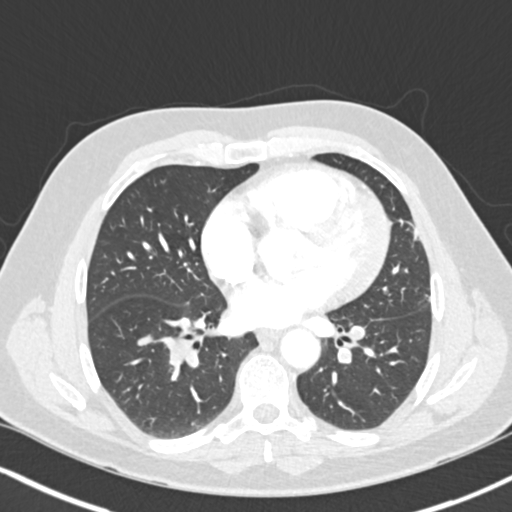
[im 127/281  mediastinal]
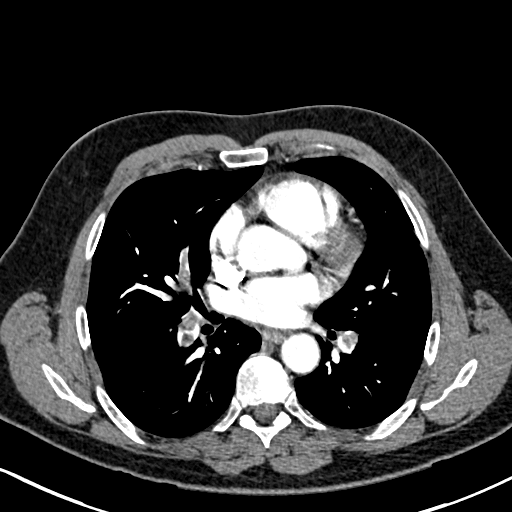
[im 141/281  lung]
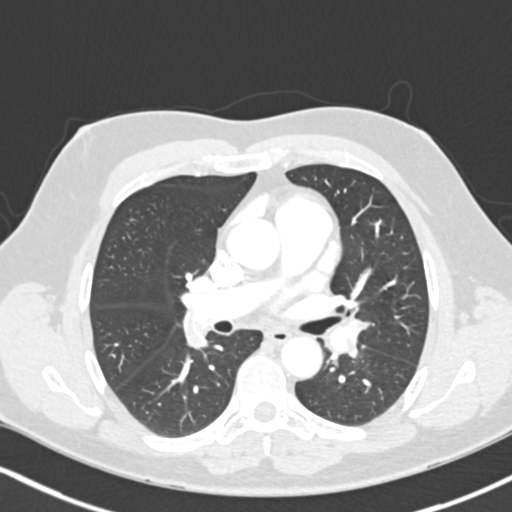
[im 155/281  mediastinal]
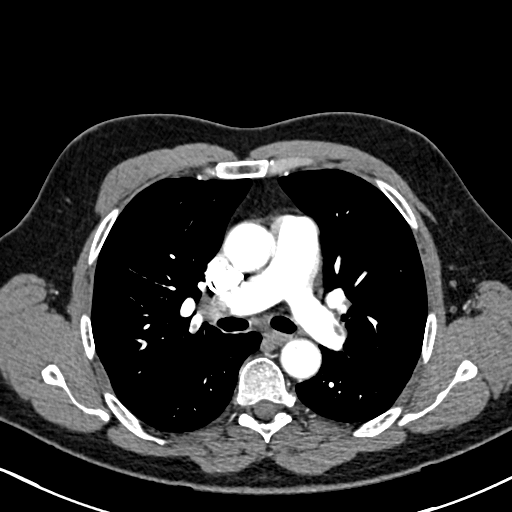
[im 169/281  lung]
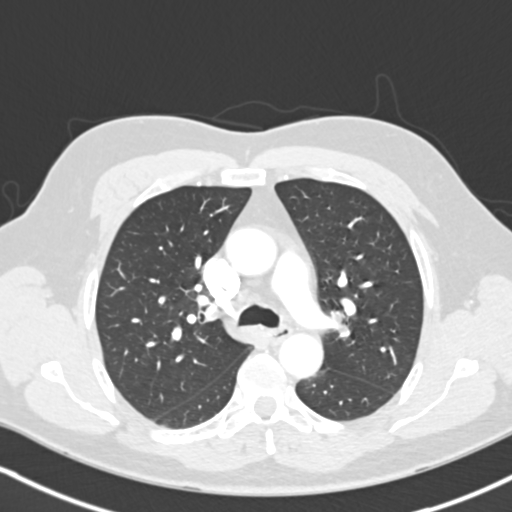
[im 183/281  mediastinal]
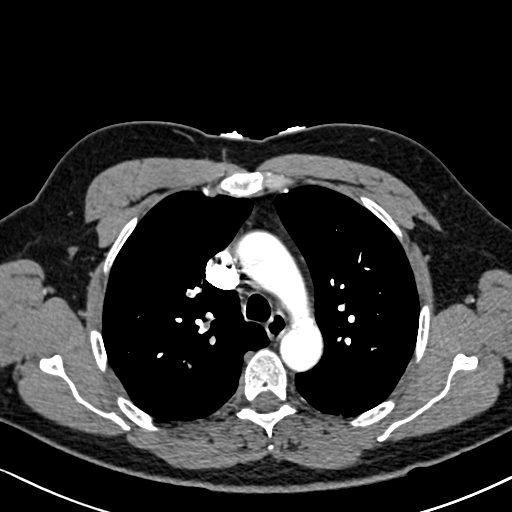
[im 197/281  lung]
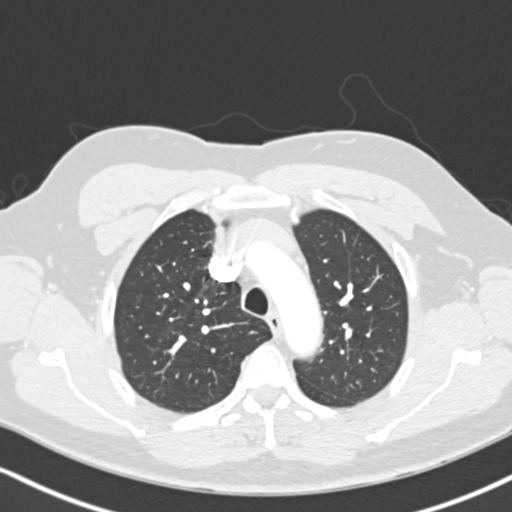
[im 225/281  mediastinal]
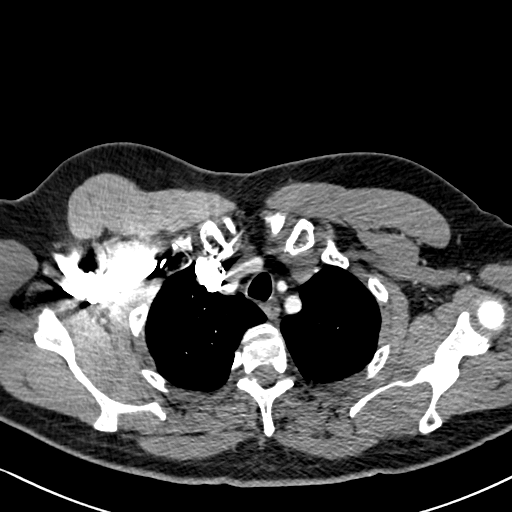
[im 239/281  lung]
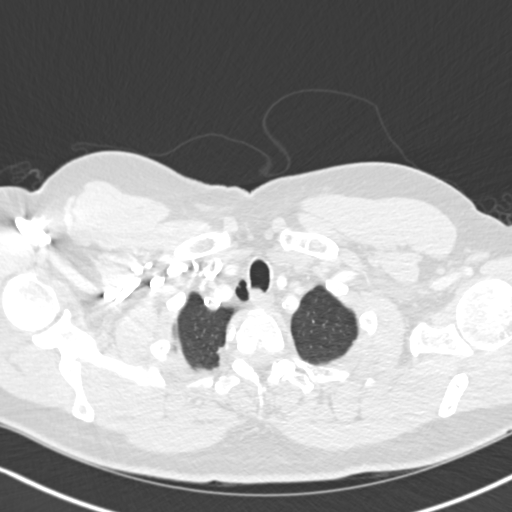
[im 253/281  mediastinal]
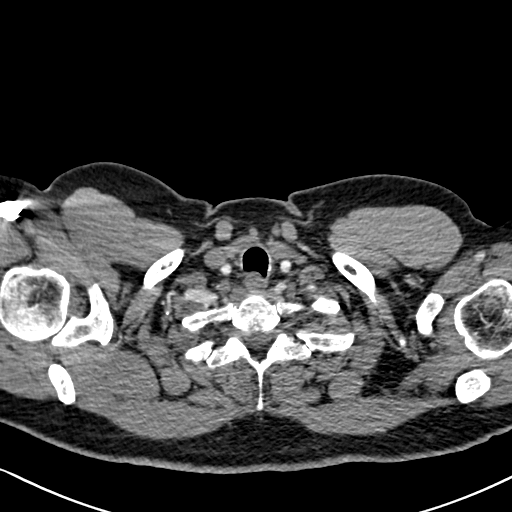
[im 267/281  lung]
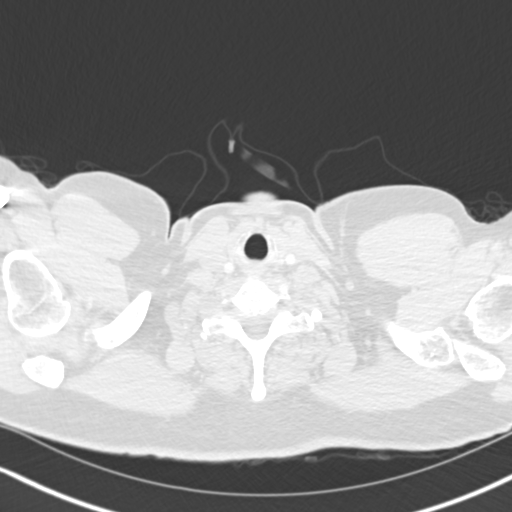

[Series 8: pe 2.0 coronal · coronal · 0.56mm/px · 1 of 131 slices shown]
[im 66/131  mediastinal]
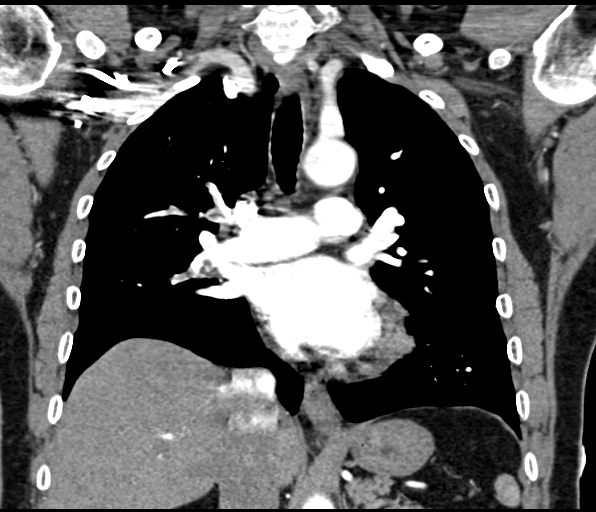

[18 of 36 positions shown; findings below may reference images not displayed]

FINDINGS: There is pulmonary embolus within both main pulmonary arteries,
extending distally into segmental and subsegmental branches. This
involves all lobes of both lungs. The RV/LV ratio of 1.5 corresponds
to right heart strain and concern for submassive pulmonary embolus.

Minimal left lingular atelectasis or scarring is noted. The lungs
are otherwise clear. There is no evidence of pleural effusion or
pneumothorax. No masses are identified; no abnormal focal contrast
enhancement is seen.

The mediastinum is otherwise unremarkable in appearance. No
mediastinal lymphadenopathy is seen. No pericardial effusion is
identified. The great vessels are grossly unremarkable in
appearance. The thyroid gland is markedly diminutive and grossly
unremarkable. No axillary lymphadenopathy is seen.

A 0.7 cm hypodensity within the right hepatic lobe is nonspecific in
appearance. The visualized portions of the pancreas and adrenal
glands are within normal limits. The gallbladder is unremarkable. A
small nonspecific 5 mm node is seen adjacent to the distal
esophagus, likely within normal limits.

No acute osseous abnormalities are seen.

Review of the MIP images confirms the above findings.
IMPRESSION: 1. Pulmonary embolus within both main pulmonary arteries, extending
distally into segmental and subsegmental branches. This involves all
lobes of both lungs. Large clot burden noted. RV/LV ratio of
corresponds to right heart strain and concern for submassive
pulmonary embolus.
2. Minimal left lingular atelectasis or scarring noted. Lungs
otherwise clear.
3. Nonspecific 7 mm hypodensity within the right hepatic lobe,
likely reflecting a cyst.

Critical Value/emergent results were called by telephone at the time
of interpretation on 08/14/2014 at [DATE] to Sharo San Juan RN, who
verbally acknowledged these results.

## 2016-04-07 IMAGING — CR DG CHEST 2V
2 series · 2 of 2 positions shown · non-contrast
Comparison: None.

CLINICAL DATA: Acute onset of cough and congestion. Right calf
pain. Initial encounter.

EXAM:
CHEST  2 VIEW

[w chest pa]
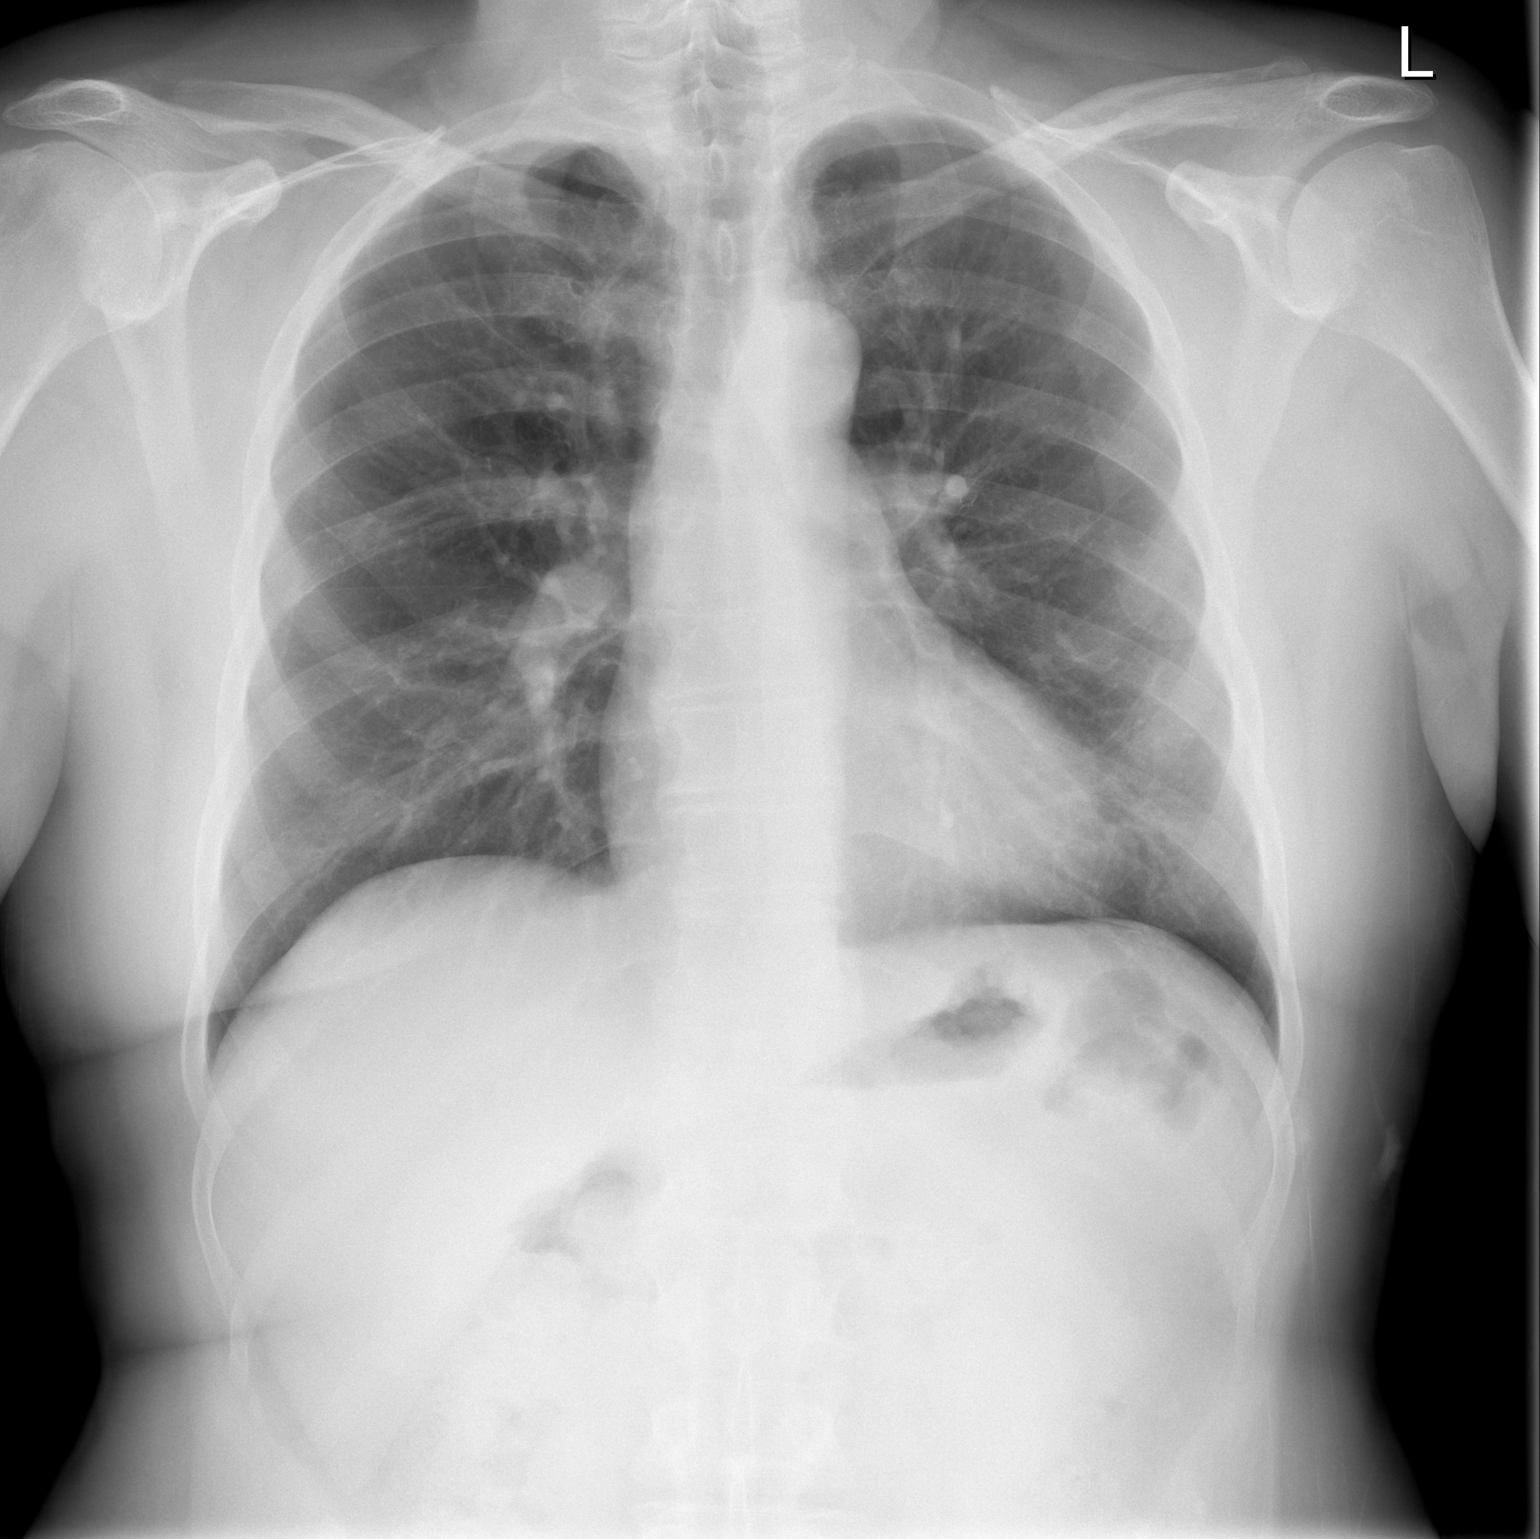

[w chest lat]
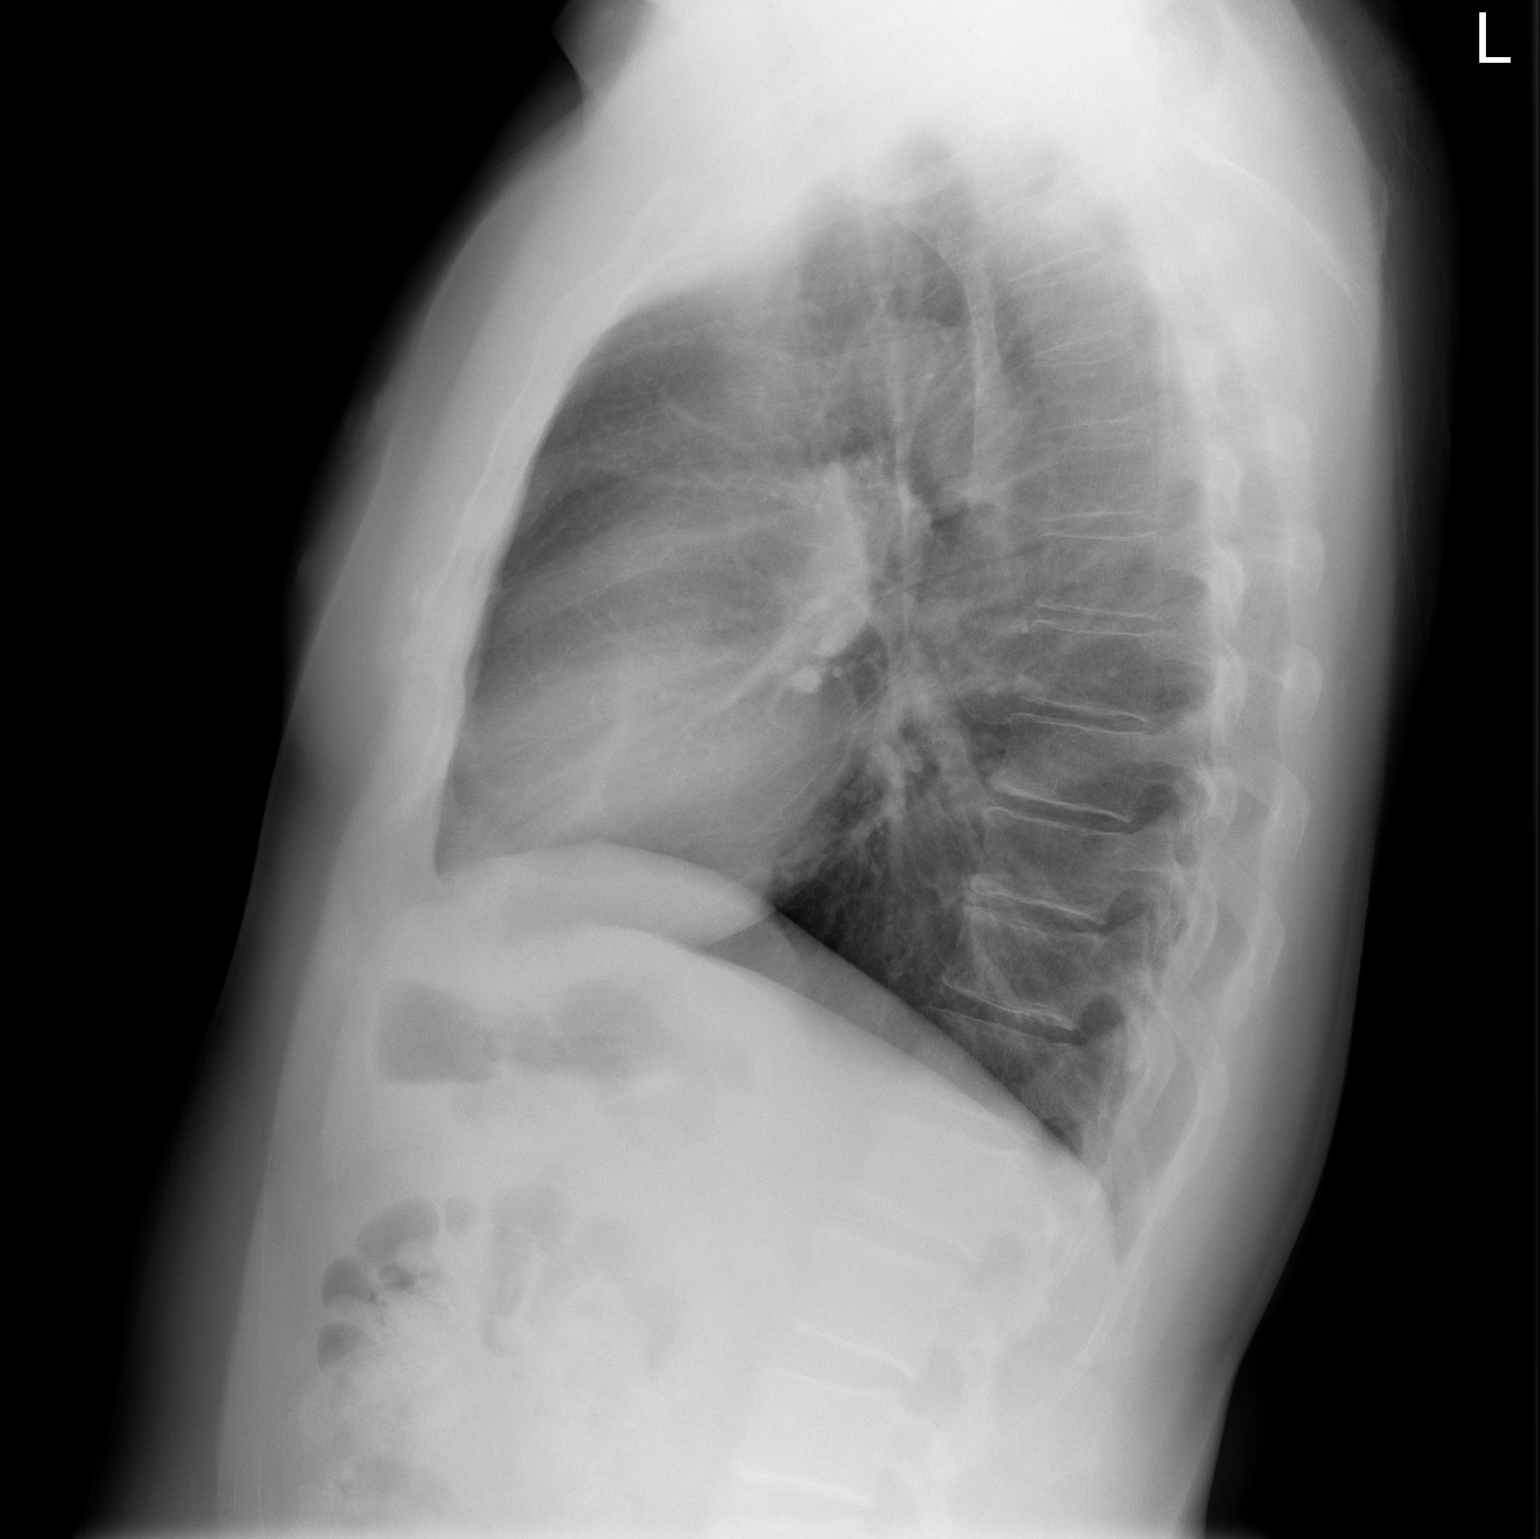

[2 of 2 positions shown; findings below may reference images not displayed]

FINDINGS: The lungs are well-aerated and clear. There is no evidence of focal
opacification, pleural effusion or pneumothorax.

The heart is normal in size; the mediastinal contour is within
normal limits. No acute osseous abnormalities are seen.
IMPRESSION: No acute cardiopulmonary process seen.

## 2016-06-08 ENCOUNTER — Other Ambulatory Visit: Payer: Self-pay | Admitting: Sports Medicine

## 2016-06-20 ENCOUNTER — Other Ambulatory Visit: Payer: Self-pay | Admitting: Sports Medicine

## 2016-06-28 ENCOUNTER — Other Ambulatory Visit: Payer: Self-pay | Admitting: Sports Medicine

## 2016-06-29 ENCOUNTER — Ambulatory Visit (INDEPENDENT_AMBULATORY_CARE_PROVIDER_SITE_OTHER): Payer: 59 | Admitting: Physician Assistant

## 2016-06-29 VITALS — BP 142/86 | HR 63 | Temp 97.3°F | Resp 16 | Wt 194.0 lb

## 2016-06-29 DIAGNOSIS — J320 Chronic maxillary sinusitis: Secondary | ICD-10-CM | POA: Diagnosis not present

## 2016-06-29 DIAGNOSIS — L989 Disorder of the skin and subcutaneous tissue, unspecified: Secondary | ICD-10-CM

## 2016-06-29 MED ORDER — PREDNISONE 20 MG PO TABS: ORAL_TABLET | ORAL | 0 refills | Status: DC

## 2016-06-30 NOTE — Progress Notes (Signed)
Patient ID: Edgar Miller MRN: [id number], DOB: May 12, 1956, 61 y.o. Date of Encounter: 06/30/2016, 7:41 AM    Chief Complaint:  Chief Complaint  Patient presents with  . pressure on right side of face     HPI: 61 y.o. year old male presents with above.   He is in the process of establishing care with Dr. Tanya Nones. However, needed to come in for this visit with me today in the interim.  Reports that he has had history of sinus infections that have required antibiotics. However points to the right maxillary sinus region and says that in this area there is constantly some amount of discomfort there. Says that at times the pain there will increase --that is when he will go get antibiotics but even after antibiotics, the pain will decrease but never completely resolves. Says that in the past he has even gone to see his dentist thinking that the pain was coming from a tooth but in fact was coming from sinuses. Says that even after antibiotics the pain never completely resolves. Says that he most recently was treated with antibiotics from urgent care about 2 months ago. Took antibiotics as directed for 10 days but says it really took about 20 days before the discomfort decreased significantly. Present only has a little bit of pressure little bit of pain. Wonders why this never completely resolves --wanted to get that evaluated today. Says that anytime he has gone in to get treatment he has just been treated with antibiotics and a prednisone as far as he knows.  Also states that prior to moving here that in the past he would go to dermatology routinely for skin checks. Says he has had multiple skin lesions removed and also has had cryotherapy to multiple lesions. Would like to go ahead and get referral to a dermatologist to see here locally.  No other concerns to address today.     Home Meds:   Outpatient Medications Prior to Visit  Medication Sig Dispense Refill  . levocetirizine (XYZAL) 5 MG  tablet TAKE 1 TABLET BY MOUTH EVERY DAY 90 tablet 1  . levothyroxine (SYNTHROID, LEVOTHROID) 112 MCG tablet Take 1 tablet (112 mcg total) by mouth daily before breakfast. 90 tablet 3  . lisinopril (PRINIVIL,ZESTRIL) 20 MG tablet TAKE 1 TABLET BY MOUTH EVERY DAY. 90 tablet 0  . lisinopril (PRINIVIL,ZESTRIL) 40 MG tablet Take 40 mg by mouth daily.    . rivaroxaban (XARELTO) 20 MG TABS tablet TAKE ONE TABLET BY MOUTH ONCE DAILY WITH SUPPER 30 tablet 5  . AZITHROMYCIN PO Take by mouth.    . fluticasone (FLONASE) 50 MCG/ACT nasal spray One spray in each nostril twice a day, use left hand for right nostril, and right hand for left nostril. (Patient not taking: Reported on 06/29/2016) 48 g 3  . glucosamine-chondroitin 500-400 MG tablet Take 1 tablet by mouth 3 (three) times daily. (Patient not taking: Reported on 02/19/2016) 270 tablet 3  . oxyCODONE-acetaminophen (PERCOCET) 5-325 MG tablet Take 1 tablet by mouth every 6 (six) hours as needed.     No facility-administered medications prior to visit.     Allergies:  Allergies  Allergen Reactions  . Codeine Nausea And Vomiting  . Penicillins Other (See Comments)    Childhood allergy      Review of Systems: See HPI for pertinent ROS. All other ROS negative.    Physical Exam: Blood pressure (!) 142/86, pulse 63, temperature 97.3 F (36.3 C), temperature source Oral, resp. rate 16,  weight 194 lb (88 kg), SpO2 98 %., Body mass index is 29.5 kg/m. General:  WNWD WM. Appears in no acute distress. HEENT: Normocephalic, atraumatic, eyes without discharge, sclera non-icteric, nares are without discharge. Bilateral auditory canals clear, TM's are without perforation, pearly grey and translucent with reflective cone of light bilaterally. Oral cavity moist, posterior pharynx without exudate, erythema, peritonsillar abscess. Right maxillary sinus with mild tenderness to percussion. No tenderness with percussion of frontal or left maxillary sinus. Neck:  Supple. No thyromegaly. No lymphadenopathy. Lungs: Clear bilaterally to auscultation without wheezes, rales, or rhonchi. Breathing is unlabored. Heart: Regular rhythm. No murmurs, rubs, or gallops. Msk:  Strength and tone normal for age. Extremities/Skin: Warm and dry. Neuro: Alert and oriented X 3. Moves all extremities spontaneously. Gait is normal. CNII-XII grossly in tact. Psych:  Responds to questions appropriately with a normal affect.     ASSESSMENT AND PLAN:  61 y.o. year old male with  1. Maxillary sinusitis, chronic He is to take prednisone as directed. If symptoms do not resolve may need CT sinuses. - predniSONE (DELTASONE) 20 MG tablet; Take 3 daily for 2 days, then 2 daily for 2 days, then 1 daily for 2 days.  Dispense: 12 tablet; Refill: 0  2. Skin lesion/ Routine Skin check - Ambulatory referral to Dermatology  He is scheduling CPE with Dr. Tanya NonesPickard. Signed, [address]Nandita Mathenia Beth KincaidDixon, [place], [facility] 06/30/2016 7:41 AM

## 2016-08-02 ENCOUNTER — Ambulatory Visit (INDEPENDENT_AMBULATORY_CARE_PROVIDER_SITE_OTHER): Payer: 59 | Admitting: Family Medicine

## 2016-08-02 ENCOUNTER — Encounter: Payer: Self-pay | Admitting: Family Medicine

## 2016-08-02 VITALS — BP 126/100 | HR 78 | Temp 97.8°F | Resp 16 | Ht 68.0 in | Wt 193.0 lb

## 2016-08-02 DIAGNOSIS — Z125 Encounter for screening for malignant neoplasm of prostate: Secondary | ICD-10-CM

## 2016-08-02 DIAGNOSIS — I10 Essential (primary) hypertension: Secondary | ICD-10-CM

## 2016-08-02 DIAGNOSIS — Z0000 Encounter for general adult medical examination without abnormal findings: Secondary | ICD-10-CM

## 2016-08-02 DIAGNOSIS — Z86718 Personal history of other venous thrombosis and embolism: Secondary | ICD-10-CM

## 2016-08-02 DIAGNOSIS — Z23 Encounter for immunization: Secondary | ICD-10-CM

## 2016-08-02 DIAGNOSIS — E039 Hypothyroidism, unspecified: Secondary | ICD-10-CM

## 2016-08-02 DIAGNOSIS — Z1211 Encounter for screening for malignant neoplasm of colon: Secondary | ICD-10-CM

## 2016-08-02 LAB — COMPLETE METABOLIC PANEL WITH GFR
ALT: 46 U/L (ref 9–46)
AST: 32 U/L (ref 10–35)
Albumin: 3.7 g/dL (ref 3.6–5.1)
Alkaline Phosphatase: 62 U/L (ref 40–115)
BUN: 25 mg/dL (ref 7–25)
CHLORIDE: 106 mmol/L (ref 98–110)
CO2: 23 mmol/L (ref 20–31)
CREATININE: 1.29 mg/dL — AB (ref 0.70–1.25)
Calcium: 9.4 mg/dL (ref 8.6–10.3)
GFR, Est African American: 69 mL/min (ref >=60)
GFR, Est Non African American: 59 mL/min — ABNORMAL LOW (ref >=60)
Glucose, Bld: 84 mg/dL (ref 70–99)
Potassium: 4.4 mmol/L (ref 3.5–5.3)
Sodium: 138 mmol/L (ref 135–146)
TOTAL PROTEIN: 6.5 g/dL (ref 6.1–8.1)
Total Bilirubin: 0.4 mg/dL (ref 0.2–1.2)

## 2016-08-02 LAB — CBC WITH DIFFERENTIAL/PLATELET
BASOS PCT: 1 %
Basophils Absolute: 64 cells/uL (ref 0–200)
Eosinophils Absolute: 384 cells/uL (ref 15–500)
Eosinophils Relative: 6 %
HEMATOCRIT: 42.5 % (ref 38.5–50.0)
Hemoglobin: 13.9 g/dL (ref 13.0–17.0)
LYMPHS PCT: 30 %
Lymphs Abs: 1920 cells/uL (ref 850–3900)
MCH: 30.5 pg (ref 27.0–33.0)
MCHC: 32.7 g/dL (ref 32.0–36.0)
MCV: 93.4 fL (ref 80.0–100.0)
MONO ABS: 640 {cells}/uL (ref 200–950)
MPV: 9.6 fL (ref 7.5–12.5)
Monocytes Relative: 10 %
NEUTROS ABS: 3392 {cells}/uL (ref 1500–7800)
Neutrophils Relative %: 53 %
PLATELETS: 337 10*3/uL (ref 140–400)
RBC: 4.55 MIL/uL (ref 4.20–5.80)
RDW: 13.2 % (ref 11.0–15.0)
WBC: 6.4 10*3/uL (ref 3.8–10.8)

## 2016-08-02 LAB — LIPID PANEL
Cholesterol: 179 mg/dL (ref <200)
HDL: 41 mg/dL (ref >40)
LDL CALC: 117 mg/dL — AB (ref <100)
Total CHOL/HDL Ratio: 4.4 Ratio (ref <5.0)
Triglycerides: 107 mg/dL (ref <150)
VLDL: 21 mg/dL (ref <30)

## 2016-08-02 LAB — TSH: TSH: 7.83 m[IU]/L — AB (ref 0.40–4.50)

## 2016-08-02 NOTE — Progress Notes (Signed)
Subjective:    Patient ID: Edgar Miller, male    DOB: 11/11/1955, 61 y.o.   MRN: [id number]  HPI Patient is a very pleasant 61 year old white male here today to establish care and for complete physical exam. Past medical history significant for hypothyroidism, essential hypertension, and also DVT/PE 2. The first time he had a DVT was after prolonged car rides back and forth to [place]. After prolonged immobility, he developed a DVT in his left leg he believes with resultant pulmonary embolism. 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 After that decision he decided to remain on lifelong anticoagulation. Last colonoscopy was 11 years ago. He is due for the shingles vaccine as well as a tetanus shot. He declines a flu shot. Past Medical History:  Diagnosis Date  . Collagen vascular disease (HCC)   . Hypertension   . Hypothyroidism   . Pulmonary embolism [facility])    Past Surgical History:  Procedure Laterality Date  . ANKLE SURGERY    . Heart ablation     Current Outpatient Prescriptions on File Prior to Visit  Medication Sig Dispense Refill  . levocetirizine (XYZAL) 5 MG tablet TAKE 1 TABLET BY MOUTH EVERY DAY 90 tablet 1  . levothyroxine (SYNTHROID, LEVOTHROID) 112 MCG tablet Take 1 tablet (112 mcg total) by mouth daily before breakfast. 90 tablet 3  . lisinopril (PRINIVIL,ZESTRIL) 20 MG tablet TAKE 1 TABLET BY MOUTH EVERY DAY. 90 tablet 0  . rivaroxaban (XARELTO) 20 MG TABS tablet TAKE ONE TABLET BY MOUTH ONCE DAILY WITH SUPPER 30 tablet 5   No current facility-administered medications on file prior to visit.    Allergies  Allergen Reactions  . Codeine Nausea And Vomiting  . Penicillins Other (See Comments)    Childhood allergy   Social History   Social History  . Marital status: Married    Spouse name: N/A  . Number of children: N/A  . Years of education: N/A   Occupational  History  . Not on file.   Social History Main Topics  . Smoking status: Never Smoker  . Smokeless tobacco: Never Used  . Alcohol use No  . Drug use: Unknown  . Sexual activity: Yes   Other Topics Concern  . Not on file   Social History Narrative  . No narrative on file     Family History  Problem Relation Age of Onset  . Stroke Mother   . Heart failure Father   . Atrial fibrillation Father     Review of Systems  All other systems reviewed and are negative.      Objective:   Physical Exam  Constitutional: He is oriented to person, place, and time. He appears well-developed and well-nourished. No distress.  HENT:  Head: Normocephalic and atraumatic.  Right Ear: External ear normal.  Left Ear: External ear normal.  Nose: Nose normal.  Mouth/Throat: Oropharynx is clear and moist. No oropharyngeal exudate.  Eyes: Conjunctivae and EOM are normal. Pupils are equal, round, and reactive to light. Right eye exhibits no discharge. Left eye exhibits no discharge. No scleral icterus.  Neck: Normal range of motion. Neck supple. No JVD present. No tracheal deviation present. No thyromegaly present.  Cardiovascular: Normal rate, regular rhythm, normal heart sounds and intact distal pulses.  Exam reveals no gallop and no friction rub.   No murmur heard. Pulmonary/Chest: Effort normal and breath sounds normal. No stridor. No respiratory distress. He has no  wheezes. He has no rales. He exhibits no tenderness.  Abdominal: Soft. Bowel sounds are normal. He exhibits no distension and no mass. There is no tenderness. There is no rebound.  Genitourinary: Rectum normal, prostate normal and penis normal.  Musculoskeletal: Normal range of motion. He exhibits no edema, tenderness or deformity.  Lymphadenopathy:    He has no cervical adenopathy.  Neurological: He is alert and oriented to person, place, and time. He has normal reflexes. He displays normal reflexes. No cranial nerve deficit. He  exhibits normal muscle tone. Coordination normal.  Skin: Skin is warm. No rash noted. He is not diaphoretic. No erythema. No pallor.  Psychiatric: He has a normal mood and affect. His behavior is normal. Judgment and thought content normal.  Vitals reviewed.         Assessment & Plan:  Annual physical exam - Plan: PSA  Essential hypertension - Plan: CBC with Differential/Platelet, COMPLETE METABOLIC PANEL WITH GFR, Lipid panel, PSA  Hypothyroidism, unspecified type - Plan: TSH  Need for diphtheria-tetanus-pertussis (Tdap) vaccine - Plan: Tdap vaccine greater than or equal to 7yo IM  Need for Zostavax administration - Plan: Varicella-zoster vaccine subcutaneous  History of recurrent deep vein thrombosis (DVT)  Screen for colon cancer  Screening for prostate cancer  Physical exam today is significant for elevated blood pressure with a diastolic blood pressure of 100. Patient states that he has white coat syndrome and so he will check his blood pressure frequently at home over the next week and provide me the values to review. If persistently elevated, I would add hydrochlorothiazide. Rectal exam today is normal I will check a PSA. I'll also refer the patient for colonoscopy to obtain his colon cancer screening. He declines a flu shot. He consents to and received the tetanus vaccine as well as the shingles vaccine. I will also check a CBC, CMP, and fasting lipid panel

## 2016-08-03 ENCOUNTER — Encounter: Payer: Self-pay | Admitting: Gastroenterology

## 2016-08-03 LAB — PSA: PSA: 0.5 ng/mL (ref <=4.0)

## 2016-08-04 ENCOUNTER — Encounter: Payer: Self-pay | Admitting: Family Medicine

## 2016-08-04 ENCOUNTER — Other Ambulatory Visit: Payer: Self-pay | Admitting: Family Medicine

## 2016-08-04 MED ORDER — LEVOTHYROXINE SODIUM 125 MCG PO TABS: 125.0000 ug | ORAL_TABLET | Freq: Every day | ORAL | 3 refills | Status: DC

## 2016-09-09 ENCOUNTER — Ambulatory Visit (INDEPENDENT_AMBULATORY_CARE_PROVIDER_SITE_OTHER): Payer: 59 | Admitting: Gastroenterology

## 2016-09-09 ENCOUNTER — Encounter: Payer: Self-pay | Admitting: Gastroenterology

## 2016-09-09 VITALS — BP 118/82 | HR 80 | Ht 67.0 in | Wt 196.0 lb

## 2016-09-09 DIAGNOSIS — K219 Gastro-esophageal reflux disease without esophagitis: Secondary | ICD-10-CM

## 2016-09-09 DIAGNOSIS — Z7901 Long term (current) use of anticoagulants: Secondary | ICD-10-CM | POA: Diagnosis not present

## 2016-09-09 DIAGNOSIS — Z1211 Encounter for screening for malignant neoplasm of colon: Secondary | ICD-10-CM

## 2016-09-09 NOTE — Patient Instructions (Signed)
Your physician has requested that you go to the basement for the following lab work before leaving today:ifob.

## 2016-09-09 NOTE — Progress Notes (Signed)
HPI :  61 y/o male with a history of recurrent DVT / PE on Xarelto, HTN, hypothyroidism, here for a new patient visit to discuss colon cancer screening.  He had a colonoscopy at age 6, no report available. He denies polyps or abnormalities. No FH of colon cancer.  He's not had any significant rectal bleeding. No diarrhea or constipation. No abdominal pains. No weight loss.  Eating well, no nausea or vomiting. He has some occasional heartburn, sleeps with head of bed elevated, bothers him once every few weeks. He does not take anything for it. No dysphagia. No esophageal cancer.   He takes Xarelto for history of PE about 5 years ago, and then had a recurrence of PE / DVTs roughly 1-2 years ago. He has been on Xarelto since that time without issues. DVTs thought to be due to travel at the time but on anticoagulation indefinitely. He exercises routinely without limitations.   He has a history of cardiac ablation for an arrhythmia and has since been okay.   GFR 59 on last renal function testing  Past Medical History:  Diagnosis Date  . Allergic rhinitis   . Collagen vascular disease (HCC)   . Hypertension   . Hypothyroidism   . Pulmonary embolism [facility])      Past Surgical History:  Procedure Laterality Date  . ANKLE SURGERY Right   . Heart ablation    . KNEE ARTHROSCOPY WITH MENISCAL REPAIR    . THUMB ARTHROSCOPY Left    pins   Family History  Problem Relation Age of Onset  . Stroke Mother   . Hypothyroidism Mother   . Heart failure Father   . Atrial fibrillation Father   . Prostate cancer Paternal Grandfather   . Bladder Cancer Paternal Grandfather   . Kidney disease Maternal Grandfather    Social History  Substance Use Topics  . Smoking status: Never Smoker  . Smokeless tobacco: Never Used  . Alcohol use No   Current Outpatient Prescriptions  Medication Sig Dispense Refill  . levocetirizine (XYZAL) 5 MG tablet TAKE 1 TABLET BY MOUTH EVERY DAY 90 tablet 1  .  levothyroxine (SYNTHROID, LEVOTHROID) 125 MCG tablet Take 1 tablet (125 mcg total) by mouth daily. 90 tablet 3  . lisinopril (PRINIVIL,ZESTRIL) 20 MG tablet TAKE 1 TABLET BY MOUTH EVERY DAY. 90 tablet 0  . rivaroxaban (XARELTO) 20 MG TABS tablet TAKE ONE TABLET BY MOUTH ONCE DAILY WITH SUPPER 30 tablet 5   No current facility-administered medications for this visit.    Allergies  Allergen Reactions  . Codeine Nausea And Vomiting  . Penicillins Other (See Comments)    Childhood allergy     Review of Systems: All systems reviewed and negative except where noted in HPI.   Lab Results  Component Value Date   WBC 6.4 08/02/2016   HGB 13.9 08/02/2016   HCT 42.5 08/02/2016   MCV 93.4 08/02/2016   PLT 337 08/02/2016    Lab Results  Component Value Date   CREATININE 1.29 (H) 08/02/2016   BUN 25 08/02/2016   NA 138 08/02/2016   K 4.4 08/02/2016   CL 106 08/02/2016   CO2 23 08/02/2016    Lab Results  Component Value Date   ALT 46 08/02/2016   AST 32 08/02/2016   ALKPHOS 62 08/02/2016   BILITOT 0.4 08/02/2016     Physical Exam: BP 118/82 (BP Location: Left Arm, Patient Position: Sitting, Cuff Size: Normal)   Pulse 80   Ht   (1.702 m) Comment: height measured without shoes  Wt 196 lb (88.9 kg)   BMI 30.70 kg/m  Constitutional: Pleasant,well-developed, male in no acute distress. HEENT: Normocephalic and atraumatic. Conjunctivae are normal. No scleral icterus. Neck supple.  Cardiovascular: Normal rate, regular rhythm.  Pulmonary/chest: Effort normal and breath sounds normal. No wheezing, rales or rhonchi. Abdominal: Soft, nondistended, nontender. There are no masses palpable. No hepatomegaly. Extremities: no edema Lymphadenopathy: No cervical adenopathy noted. Neurological: Alert and oriented to person place and time. Skin: Skin is warm and dry. No rashes noted. Psychiatric: Normal mood and affect. Behavior is normal.   ASSESSMENT AND PLAN: 61 year old male with  a history of DVT and PE on Xarelto, here to discuss colon cancer screening. Also has a history of reflux.  Colon cancer screening - he is average risk without any symptoms or strong family history of colon cancer, and a  prior normal colonoscopy. He is due for colon cancer screening at this time although will obtain his prior colonoscopy report to confirm findings. I discussed colon cancer screening options with him to include optical colonoscopy and stool based testing. If he were to have an optical colonoscopy he would need to come off Xarelto, which he is a bit anxious about given his history of recurrent PEs. In this light I think stool based testing is very reasonable, discussed FIT testing versus Cologuard. Following this discussion he wished to proceed with FIT testing, which is recommended to be done annually. If positive, a colonoscopy will be recommended, and he is agreeable to proceed with colonoscopy in this setting. All questions answered.   GERD - mild intermittent symptoms, recommend Zantac as needed if he wishes to use something, he prefers to use lifestyle modification such as avoiding eating prior to bedtime and elevation of head of bed when sleeping. He can follow up as needed for this issue.   Ileene Patrick, MD [facility] Pager [phone]  CC:  Donita Brooks, MD

## 2016-09-22 ENCOUNTER — Other Ambulatory Visit: Payer: Self-pay | Admitting: Adult Health

## 2016-09-24 ENCOUNTER — Other Ambulatory Visit: Payer: Self-pay | Admitting: Sports Medicine

## 2016-10-09 ENCOUNTER — Other Ambulatory Visit: Payer: Self-pay | Admitting: Sports Medicine

## 2016-12-08 ENCOUNTER — Other Ambulatory Visit: Payer: Self-pay | Admitting: *Deleted

## 2016-12-08 MED ORDER — LEVOCETIRIZINE DIHYDROCHLORIDE 5 MG PO TABS: 5.0000 mg | ORAL_TABLET | Freq: Every day | ORAL | 1 refills | Status: DC

## 2017-01-06 ENCOUNTER — Other Ambulatory Visit: Payer: Self-pay | Admitting: Sports Medicine

## 2017-03-20 ENCOUNTER — Other Ambulatory Visit: Payer: Self-pay | Admitting: Adult Health

## 2017-04-06 ENCOUNTER — Other Ambulatory Visit: Payer: Self-pay | Admitting: Sports Medicine

## 2017-04-12 ENCOUNTER — Other Ambulatory Visit: Payer: Self-pay | Admitting: Family Medicine

## 2017-04-12 MED ORDER — LEVOCETIRIZINE DIHYDROCHLORIDE 5 MG PO TABS: 5.0000 mg | ORAL_TABLET | Freq: Every day | ORAL | 1 refills | Status: DC

## 2017-04-12 MED ORDER — LISINOPRIL 20 MG PO TABS: 20.0000 mg | ORAL_TABLET | Freq: Every day | ORAL | 1 refills | Status: DC

## 2017-04-12 MED ORDER — LEVOTHYROXINE SODIUM 125 MCG PO TABS: 125.0000 ug | ORAL_TABLET | Freq: Every day | ORAL | 1 refills | Status: DC

## 2017-04-21 ENCOUNTER — Other Ambulatory Visit: Payer: Self-pay | Admitting: Adult Health

## 2017-04-24 ENCOUNTER — Telehealth: Payer: Self-pay | Admitting: Adult Health

## 2017-04-24 MED ORDER — RIVAROXABAN 20 MG PO TABS: ORAL_TABLET | ORAL | 0 refills | Status: DC

## 2017-04-24 NOTE — Telephone Encounter (Signed)
Spoke with pt and advised he would need to make an appt for additional refills. He made an appt with TP on 11/30. At 4:15 pm. I sent in one refill so pt would not run out of medication. Pt understood and nothing further is needed.

## 2017-04-28 ENCOUNTER — Ambulatory Visit (INDEPENDENT_AMBULATORY_CARE_PROVIDER_SITE_OTHER): Payer: 59 | Admitting: Adult Health

## 2017-04-28 ENCOUNTER — Encounter: Payer: Self-pay | Admitting: Adult Health

## 2017-04-28 DIAGNOSIS — Z23 Encounter for immunization: Secondary | ICD-10-CM | POA: Diagnosis not present

## 2017-04-28 DIAGNOSIS — Z7901 Long term (current) use of anticoagulants: Secondary | ICD-10-CM | POA: Diagnosis not present

## 2017-04-28 MED ORDER — RIVAROXABAN 20 MG PO TABS: ORAL_TABLET | ORAL | 3 refills | Status: DC

## 2017-04-28 NOTE — Assessment & Plan Note (Signed)
Cont on Xarelto  

## 2017-04-28 NOTE — Progress Notes (Signed)
 @Patient  ID: Edgar Miller, male    DOB: Jun 05, 1955, 61 y.o.   MRN: [id number]  Chief Complaint  Patient presents with  . Follow-up    Referring provider: Donita BrooksPickard, Warren T, MD  HPI: HPI 61  yo male admitted 08/14/14 for PE /RLE DVT seen by pulmonary for consult   TEST /Events  Was admitted March 17 to August 16 2014 for an acute pulmonary embolism and right lower extremity DVT. CT angiogram of the chest showed pulmonary embolism involving bilateral pulmonary arteries. Venous Doppler showed a positive right lower extremity DVT-occlusive DVT with the proximal posterior tibial vein.  This was patient's second PE. He had extensive travel back from [place] recently.  2-D echo showed an EF of 55-60%, right ventricle moderately dilated., PAP 49  CT chest 02/2015 neg PE  Echo 02/2015 EF nml , PAP nml   04/28/2017 Follow up : Recurrent PE/DVT on Xarelto  Pt returns for 1 year follow up . He has had recurrent PE /DVT in past on lifelong anitcoagulation.  Denies any known bleeding. Feels he is tolerating well. Denies chest pain , orthopnea, edema or hemoptysis .  Pt education on anticoagulants.  Has PCP gets yearly CPX. CBC in March showed normal h/h.    Allergies  Allergen Reactions  . Codeine Nausea And Vomiting  . Penicillins Other (See Comments)    Childhood allergy    Immunization History  Administered Date(s) Administered  . Influenza Split 03/30/2014, 02/28/2016  . Influenza,inj,Quad PF,6+ Mos 04/28/2017  . Tdap 08/02/2016  . Zoster 08/02/2016    Past Medical History:  Diagnosis Date  . Allergic rhinitis   . Collagen vascular disease (HCC)   . Hypertension   . Hypothyroidism   . Pulmonary embolism (HCC)     Tobacco History: Social History   Tobacco Use  Smoking Status Never Smoker  Smokeless Tobacco Never Used   Counseling given: Not Answered   Outpatient Encounter Medications as of 04/28/2017  Medication Sig  . levocetirizine (XYZAL) 5 MG tablet  Take 1 tablet (5 mg total) daily by mouth.  . levothyroxine (SYNTHROID, LEVOTHROID) 125 MCG tablet Take 1 tablet (125 mcg total) daily by mouth.  Marland Kitchen. lisinopril (PRINIVIL,ZESTRIL) 20 MG tablet Take 1 tablet (20 mg total) daily by mouth.  . rivaroxaban (XARELTO) 20 MG TABS tablet TAKE ONE TABLET BY MOUTH ONCE DAILY WITH SUPPER  . [DISCONTINUED] levothyroxine (SYNTHROID, LEVOTHROID) 112 MCG tablet TAKE 1 TABLET (112 MCG TOTAL) BY MOUTH DAILY BEFORE BREAKFAST. (Patient not taking: Reported on 04/28/2017)   No facility-administered encounter medications on file as of 04/28/2017.      Review of Systems  Constitutional:   No  weight loss, night sweats,  Fevers, chills, fatigue, or  lassitude.  HEENT:   No headaches,  Difficulty swallowing,  Tooth/dental problems, or  Sore throat,                No sneezing, itching, ear ache, nasal congestion, post nasal drip,   CV:  No chest pain,  Orthopnea, PND, swelling in lower extremities, anasarca, dizziness, palpitations, syncope.   GI  No heartburn, indigestion, abdominal pain, nausea, vomiting, diarrhea, change in bowel habits, loss of appetite, bloody stools.   Resp: No shortness of breath with exertion or at rest.  No excess mucus, no productive cough,  No non-productive cough,  No coughing up of blood.  No change in color of mucus.  No wheezing.  No chest wall deformity  Skin: no rash or lesions.  GU: no dysuria, change in color of urine, no urgency or frequency.  No flank pain, no hematuria   MS:  No joint pain or swelling.  No decreased range of motion.  No back pain.    Physical Exam  BP 116/74 (BP Location: Left Arm, Cuff Size: Normal)   Pulse 61   Ht 5\' 8"  (1.727 m)   Wt 199 lb 12.8 oz (90.6 kg)   SpO2 96%   BMI 30.38 kg/m   GEN: A/Ox3; pleasant , NAD, well nourished    HEENT:  [place]/AT,  EACs-clear, TMs-wnl, NOSE-clear, THROAT-clear, no lesions, no postnasal drip or exudate noted.   NECK:  Supple w/ fair ROM; no JVD; normal  carotid impulses w/o bruits; no thyromegaly or nodules palpated; no lymphadenopathy.    RESP  Clear  P & A; w/o, wheezes/ rales/ or rhonchi. no accessory muscle use, no dullness to percussion  CARD:  RRR, no m/r/g, no peripheral edema, pulses intact, no cyanosis or clubbing.  GI:   Soft & nt; nml bowel sounds; no organomegaly or masses detected.   Musco: Warm bil, no deformities or joint swelling noted.   Neuro: alert, no focal deficits noted.    Skin: Warm, no lesions or rashes    Lab Results:  CBC  No results found for: PROBNP  Imaging: No results found.   Assessment & Plan:   Pulmonary emboli (HCC) Recurrent PE /DVT (dx with 2nd PE in 2016) w/ DVT  Will continue on lifelong Xarelto . Appears to be doing well   Plan  Patient Instructions  Avoid NSAIDS (advil, ibuprofen, aleve,naproxen , excedrin, etc)  These can cause bleeding.  Continue on Xarelto .  Flu shot today .  Follow up Dr. Delton CoombesByrum  In  1 year and as needed   Please contact office for sooner follow up if symptoms do not improve or worsen or seek emergency care      DVT (deep venous thrombosis) Cont on Xarelto       Rubye Oaksammy Brayon Bielefeld, NP 04/28/2017

## 2017-04-28 NOTE — Assessment & Plan Note (Signed)
Recurrent PE /DVT (dx with 2nd PE in 2016) w/ DVT  Will continue on lifelong Xarelto . Appears to be doing well   Plan  Patient Instructions  Avoid NSAIDS (advil, ibuprofen, aleve,naproxen , excedrin, etc)  These can cause bleeding.  Continue on Xarelto .  Flu shot today .  Follow up Dr. Delton CoombesByrum  In  1 year and as needed   Please contact office for sooner follow up if symptoms do not improve or worsen or seek emergency care

## 2017-04-28 NOTE — Addendum Note (Signed)
Addended by: Boone MasterJONES, JESSICA E on: 04/28/2017 05:13 PM   Modules accepted: Orders

## 2017-04-28 NOTE — Patient Instructions (Signed)
Avoid NSAIDS (advil, ibuprofen, aleve,naproxen , excedrin, etc)  These can cause bleeding.  Continue on Xarelto .  Flu shot today .  Follow up Dr. Delton CoombesByrum  In  1 year and as needed   Please contact office for sooner follow up if symptoms do not improve or worsen or seek emergency care

## 2017-08-02 ENCOUNTER — Ambulatory Visit: Payer: 59 | Admitting: Family Medicine

## 2017-08-02 ENCOUNTER — Other Ambulatory Visit: Payer: Self-pay

## 2017-08-02 ENCOUNTER — Encounter: Payer: Self-pay | Admitting: Family Medicine

## 2017-08-02 VITALS — BP 142/64 | HR 80 | Temp 98.3°F | Resp 16 | Ht 68.0 in | Wt 201.0 lb

## 2017-08-02 DIAGNOSIS — J342 Deviated nasal septum: Secondary | ICD-10-CM

## 2017-08-02 DIAGNOSIS — J019 Acute sinusitis, unspecified: Secondary | ICD-10-CM | POA: Diagnosis not present

## 2017-08-02 MED ORDER — AZITHROMYCIN 250 MG PO TABS: ORAL_TABLET | ORAL | 0 refills | Status: DC

## 2017-08-02 NOTE — Patient Instructions (Signed)
Referral to ENT Take antibiotics as prescribed F/U as needed  

## 2017-08-02 NOTE — Progress Notes (Signed)
   Subjective:    Patient ID: Edgar Miller, male    DOB: 27-Oct-1955, 62 y.o.   MRN: [id number]  Patient presents for Illness (x1 week- sinus congestion, productive cough with green to brown colored mucus, head congestion, fluid in ear)  Sinus pressure and drainge headache,post nasal, for past week or so. THick mucous. Typically gets once a year. Using mucinex. Uses xyzal. Uses nasal rinse  - Allergic to PCN     Review Of Systems:  GEN- denies fatigue, fever, weight loss,weakness, recent illness HEENT- denies eye drainage, change in vision, +nasal discharge, CVS- denies chest pain, palpitations RESP- denies SOB, cough, wheeze ABD- denies N/V, change in stools, abd pain GU- denies dysuria, hematuria, dribbling, incontinence MSK- denies joint pain, muscle aches, injury Neuro- denies headache, dizziness, syncope, seizure activity       Objective:    BP (!) 142/64   Pulse 80   Temp 98.3 F (36.8 C) (Oral)   Resp 16   Ht 5\' 8"  (1.727 m)   Wt 201 lb (91.2 kg)   SpO2 97%   BMI 30.56 kg/m  GEN- NAD, alert and oriented x3 HEENT- PERRL, EOMI, non injected sclera, pink conjunctiva, MMM, oropharynx mild injection, TM clear bilat no effusion,deviated septum  + mild right frotal sinus tenderness, inflammed turbinates,  Nasal drainage  Neck- Supple, no LAD CVS- RRR, no murmur RESP-CTAB EXT- No edema Pulses- Radial 2+          Assessment & Plan:      Problem List Items Addressed This Visit    None    Visit Diagnoses    Acute rhinosinusitis    -  Primary   Add zpak to regimen, deviated septum difficulty clearing nasal passages and sinuses, continue nasal rinse, anti-histamine, consult with ENT   Relevant Medications   azithromycin (ZITHROMAX) 250 MG tablet   Deviated septum       Relevant Orders   Ambulatory referral to ENT      Note: This dictation was prepared with Dragon dictation along with smaller phrase technology. Any transcriptional errors that result from  this process are unintentional.

## 2017-10-17 ENCOUNTER — Other Ambulatory Visit: Payer: Self-pay

## 2017-10-17 MED ORDER — LEVOTHYROXINE SODIUM 125 MCG PO TABS: 125.0000 ug | ORAL_TABLET | Freq: Every day | ORAL | 0 refills | Status: DC

## 2017-10-19 ENCOUNTER — Telehealth: Payer: Self-pay | Admitting: Family Medicine

## 2017-10-19 MED ORDER — LISINOPRIL 20 MG PO TABS: 20.0000 mg | ORAL_TABLET | Freq: Every day | ORAL | 1 refills | Status: DC

## 2017-10-19 NOTE — Telephone Encounter (Signed)
Prescription sent to pharmacy.

## 2017-10-19 NOTE — Telephone Encounter (Signed)
Patient left vm asking for a refill on his lisinopril he uses [facility], [place], [address] He would like to pick this up when he picks up his levothyroxine that was called in yesterday.  CB# [phone]

## 2017-11-10 ENCOUNTER — Other Ambulatory Visit: Payer: Self-pay | Admitting: Family Medicine

## 2017-11-16 ENCOUNTER — Ambulatory Visit: Payer: 59 | Admitting: Family Medicine

## 2017-11-16 ENCOUNTER — Encounter: Payer: Self-pay | Admitting: Family Medicine

## 2017-11-16 VITALS — BP 130/84 | HR 64 | Temp 97.6°F | Resp 18 | Ht 68.0 in | Wt 198.0 lb

## 2017-11-16 DIAGNOSIS — Z0000 Encounter for general adult medical examination without abnormal findings: Secondary | ICD-10-CM | POA: Diagnosis not present

## 2017-11-16 DIAGNOSIS — I10 Essential (primary) hypertension: Secondary | ICD-10-CM

## 2017-11-16 DIAGNOSIS — I82401 Acute embolism and thrombosis of unspecified deep veins of right lower extremity: Secondary | ICD-10-CM | POA: Diagnosis not present

## 2017-11-16 DIAGNOSIS — E039 Hypothyroidism, unspecified: Secondary | ICD-10-CM | POA: Diagnosis not present

## 2017-11-16 NOTE — Progress Notes (Signed)
Subjective:    Patient ID: Edgar Miller, male    DOB: [birth year], 62 y.o.   MRN: [id number]  HPI Patient is a very pleasant 62 year old white male here today to establish care and for complete physical exam. Past medical history significant for hypothyroidism, essential hypertension, and also DVT/PE 2. The first time he had a DVT was after prolonged car rides back and forth to [place]. After prolonged immobility, he developed a DVT in his left leg he believes with resultant pulmonary embolism. 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 After that decision he decided to remain on lifelong anticoagulation.  Patient is overdue for a colonoscopy.  He is also due for prostate cancer screening.  He is due for HIV and hepatitis C screening but he politely declines him.  Immunizations are up-to-date as listed below: Immunization History  Administered Date(s) Administered  . Influenza Split 03/30/2014, 02/28/2016  . Influenza,inj,Quad PF,6+ Mos 04/28/2017  . Tdap 08/02/2016  . Zoster 08/02/2016    Past Medical History:  Diagnosis Date  . Allergic rhinitis   . Hypertension   . Hypothyroidism   . Pulmonary embolism [facility])    Past Surgical History:  Procedure Laterality Date  . ANKLE SURGERY Right   . Heart ablation    . KNEE ARTHROSCOPY WITH MENISCAL REPAIR    . THUMB ARTHROSCOPY Left    pins   Current Outpatient Medications on File Prior to Visit  Medication Sig Dispense Refill  . levocetirizine (XYZAL) 5 MG tablet Take 1 tablet (5 mg total) daily by mouth. 90 tablet 1  . levothyroxine (SYNTHROID, LEVOTHROID) 125 MCG tablet TAKE 1 TABLET BY MOUTH EVERY DAY 30 tablet 0  . lisinopril (PRINIVIL,ZESTRIL) 20 MG tablet Take 1 tablet (20 mg total) by mouth daily. 90 tablet 1  . rivaroxaban (XARELTO) 20 MG TABS tablet TAKE ONE TABLET BY MOUTH ONCE DAILY WITH SUPPER 90 tablet 3   No current  facility-administered medications on file prior to visit.    Allergies  Allergen Reactions  . Codeine Nausea And Vomiting  . Penicillins Other (See Comments)    Childhood allergy   Social History   Socioeconomic History  . Marital status: Married    Spouse name: Not on file  . Number of children: 5  . Years of education: Not on file  . Highest education level: Not on file  Occupational History  . Occupation: chief Merchant navy officer  Social Needs  . Financial resource strain: Not on file  . Food insecurity:    Worry: Not on file    Inability: Not on file  . Transportation needs:    Medical: Not on file    Non-medical: Not on file  Tobacco Use  . Smoking status: Never Smoker  . Smokeless tobacco: Never Used  Substance and Sexual Activity  . Alcohol use: No  . Drug use: No  . Sexual activity: Yes  Lifestyle  . Physical activity:    Days per week: Not on file    Minutes per session: Not on file  . Stress: Not on file  Relationships  . Social connections:    Talks on phone: Not on file    Gets together: Not on file    Attends religious service: Not on file    Active member of club or organization: Not on file    Attends meetings of clubs or organizations: Not on file    Relationship status: Not  on file  . Intimate partner violence:    Fear of current or ex partner: Not on file    Emotionally abused: Not on file    Physically abused: Not on file    Forced sexual activity: Not on file  Other Topics Concern  . Not on file  Social History Narrative  . Not on file     Family History  Problem Relation Age of Onset  . Stroke Mother   . Hypothyroidism Mother   . Heart failure Father   . Atrial fibrillation Father   . Prostate cancer Paternal Grandfather   . Bladder Cancer Paternal Grandfather   . Kidney disease Maternal Grandfather     Review of Systems  All other systems reviewed and are negative.      Objective:   Physical Exam  Constitutional: He is  oriented to person, place, and time. He appears well-developed and well-nourished. No distress.  HENT:  Head: Normocephalic and atraumatic.  Right Ear: External ear normal.  Left Ear: External ear normal.  Nose: Nose normal.  Mouth/Throat: Oropharynx is clear and moist. No oropharyngeal exudate.  Eyes: Pupils are equal, round, and reactive to light. Conjunctivae and EOM are normal. Right eye exhibits no discharge. Left eye exhibits no discharge. No scleral icterus.  Neck: Normal range of motion. Neck supple. No JVD present. No tracheal deviation present. No thyromegaly present.  Cardiovascular: Normal rate, regular rhythm, normal heart sounds and intact distal pulses. Exam reveals no gallop and no friction rub.  No murmur heard. Pulmonary/Chest: Effort normal and breath sounds normal. No stridor. No respiratory distress. He has no wheezes. He has no rales. He exhibits no tenderness.  Abdominal: Soft. Bowel sounds are normal. He exhibits no distension and no mass. There is no tenderness. There is no rebound.  Musculoskeletal: Normal range of motion. He exhibits no edema, tenderness or deformity.  Lymphadenopathy:    He has no cervical adenopathy.  Neurological: He is alert and oriented to person, place, and time. He has normal reflexes. No cranial nerve deficit. He exhibits normal muscle tone. Coordination normal.  Skin: Skin is warm. No rash noted. He is not diaphoretic. No erythema. No pallor.  Psychiatric: He has a normal mood and affect. His behavior is normal. Judgment and thought content normal.  Vitals reviewed.         Assessment & Plan:  Annual physical exam  Essential hypertension  Hypothyroidism, unspecified type  Deep vein thrombosis (DVT) of right lower extremity, unspecified chronicity, unspecified vein (HCC) Patient's blood pressure is well controlled today on his exam.  I will have the patient return fasting for CBC, CMP, fasting lipid panel, TSH, and a PSA.  He  politely declines HIV and hepatitis C screening.  He is overdue for a colonoscopy.  He states he met with the gastroenterologist however he does not want to go for colonoscopy.  He is interested in Cologuard.  He states that the gastroenterologist has already arranged for him to receive Cologuard he just has not returned to get.  I encouraged him to complete the kit to update his colon cancer screening.

## 2017-12-07 ENCOUNTER — Other Ambulatory Visit: Payer: Self-pay | Admitting: Family Medicine

## 2017-12-13 ENCOUNTER — Telehealth: Payer: Self-pay | Admitting: Family Medicine

## 2017-12-13 MED ORDER — LEVOCETIRIZINE DIHYDROCHLORIDE 5 MG PO TABS: 5.0000 mg | ORAL_TABLET | Freq: Every day | ORAL | 3 refills | Status: DC

## 2017-12-13 NOTE — Telephone Encounter (Signed)
Medication called/sent to requested pharmacy  

## 2017-12-13 NOTE — Telephone Encounter (Signed)
[facility] [place] [address]

## 2017-12-15 ENCOUNTER — Other Ambulatory Visit: Payer: 59

## 2017-12-16 LAB — LIPID PANEL
Cholesterol: 174 mg/dL (ref <200)
HDL: 43 mg/dL (ref >40)
LDL CHOLESTEROL (CALC): 116 mg/dL — AB
Non-HDL Cholesterol (Calc): 131 mg/dL (calc) — ABNORMAL HIGH (ref <130)
TRIGLYCERIDES: 63 mg/dL (ref <150)
Total CHOL/HDL Ratio: 4 (calc) (ref <5.0)

## 2017-12-16 LAB — CBC WITH DIFFERENTIAL/PLATELET
BASOS PCT: 1.9 %
Basophils Absolute: 110 cells/uL (ref 0–200)
EOS PCT: 5.8 %
Eosinophils Absolute: 336 cells/uL (ref 15–500)
HCT: 40.1 % (ref 38.5–50.0)
Hemoglobin: 13.5 g/dL (ref 13.2–17.1)
Lymphs Abs: 1264 cells/uL (ref 850–3900)
MCH: 31.3 pg (ref 27.0–33.0)
MCHC: 33.7 g/dL (ref 32.0–36.0)
MCV: 93 fL (ref 80.0–100.0)
MONOS PCT: 10.1 %
MPV: 10.4 fL (ref 7.5–12.5)
Neutro Abs: 3503 cells/uL (ref 1500–7800)
Neutrophils Relative %: 60.4 %
PLATELETS: 265 10*3/uL (ref 140–400)
RBC: 4.31 10*6/uL (ref 4.20–5.80)
RDW: 11.9 % (ref 11.0–15.0)
TOTAL LYMPHOCYTE: 21.8 %
WBC mixed population: 586 cells/uL (ref 200–950)
WBC: 5.8 10*3/uL (ref 3.8–10.8)

## 2017-12-16 LAB — COMPLETE METABOLIC PANEL WITH GFR
AG Ratio: 1.5 (calc) (ref 1.0–2.5)
ALT: 16 U/L (ref 9–46)
AST: 18 U/L (ref 10–35)
Albumin: 3.8 g/dL (ref 3.6–5.1)
Alkaline phosphatase (APISO): 60 U/L (ref 40–115)
BILIRUBIN TOTAL: 0.5 mg/dL (ref 0.2–1.2)
BUN: 20 mg/dL (ref 7–25)
CALCIUM: 9.1 mg/dL (ref 8.6–10.3)
CHLORIDE: 108 mmol/L (ref 98–110)
CO2: 24 mmol/L (ref 20–32)
Creat: 1.25 mg/dL (ref 0.70–1.25)
GFR, EST AFRICAN AMERICAN: 71 mL/min/{1.73_m2} (ref >=60)
GFR, EST NON AFRICAN AMERICAN: 61 mL/min/{1.73_m2} (ref >=60)
GLUCOSE: 95 mg/dL (ref 65–99)
Globulin: 2.5 g/dL (calc) (ref 1.9–3.7)
Potassium: 4.6 mmol/L (ref 3.5–5.3)
Sodium: 139 mmol/L (ref 135–146)
TOTAL PROTEIN: 6.3 g/dL (ref 6.1–8.1)

## 2017-12-16 LAB — TSH: TSH: 1.71 m[IU]/L (ref 0.40–4.50)

## 2017-12-16 LAB — PSA: PSA: 0.5 ng/mL (ref <=4.0)

## 2017-12-18 ENCOUNTER — Encounter (INDEPENDENT_AMBULATORY_CARE_PROVIDER_SITE_OTHER): Payer: Self-pay

## 2018-01-03 ENCOUNTER — Other Ambulatory Visit: Payer: Self-pay | Admitting: Family Medicine

## 2018-03-06 ENCOUNTER — Other Ambulatory Visit: Payer: Self-pay | Admitting: Family Medicine

## 2018-03-06 MED ORDER — LEVOTHYROXINE SODIUM 125 MCG PO TABS: 125.0000 ug | ORAL_TABLET | Freq: Every day | ORAL | 2 refills | Status: DC

## 2018-03-06 MED ORDER — LISINOPRIL 20 MG PO TABS: 20.0000 mg | ORAL_TABLET | Freq: Every day | ORAL | 1 refills | Status: DC

## 2018-03-06 MED ORDER — LEVOCETIRIZINE DIHYDROCHLORIDE 5 MG PO TABS: 5.0000 mg | ORAL_TABLET | Freq: Every day | ORAL | 3 refills | Status: DC

## 2018-05-28 ENCOUNTER — Telehealth: Payer: Self-pay | Admitting: Adult Health

## 2018-05-28 MED ORDER — RIVAROXABAN 20 MG PO TABS: ORAL_TABLET | ORAL | 0 refills | Status: DC

## 2018-05-28 NOTE — Telephone Encounter (Signed)
Order has been placed. Called and spoke with patient, one month refill given and appointment made since he has not been seen in one year. Nothing further needed.

## 2018-06-04 ENCOUNTER — Ambulatory Visit: Payer: 59 | Admitting: Adult Health

## 2018-06-11 ENCOUNTER — Encounter: Payer: Self-pay | Admitting: Adult Health

## 2018-06-11 ENCOUNTER — Ambulatory Visit: Payer: 59 | Admitting: Adult Health

## 2018-06-11 DIAGNOSIS — I2699 Other pulmonary embolism without acute cor pulmonale: Secondary | ICD-10-CM

## 2018-06-11 MED ORDER — RIVAROXABAN 20 MG PO TABS: ORAL_TABLET | ORAL | 1 refills | Status: DC

## 2018-06-11 NOTE — Progress Notes (Signed)
@Patient  ID: Edgar Miller, male    DOB: 1955/12/24, 63 y.o.   MRN: [id number]  Chief Complaint  Patient presents with  . Follow-up    pe    Referring provider: Donita Brooks, MD  HPI: 63yo male admitted 08/14/14 for PE /RLE DVT  seen by pulmonary for consult  Reports irregular heartbeat in past s/p ablation - [place] ~2007   TEST /Events Was admitted March 17 to March 192016 for an acute pulmonary embolism and right lower extremity DVT. CT angiogram of the chest showed pulmonary embolism involving bilateral pulmonary arteries. Venous Doppler showed a positive right lower extremity DVT-occlusive DVT with the proximal posterior tibial vein. This was patient's second PE. He had extensive travel back from [place] recently.  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  Patient has a history of recurrent PE and DVT in the past and is on lifelong anticoagulation.  Patient says overall he is doing well.  He denies any known bleeding.  Patient says activity level is unchanged.  He exercises during week. Plays basketball.  He denies any chest pain or hemoptysis.  Patient education on anticoagulants. Labs in July showed a normal hemoglobin.     Allergies  Allergen Reactions  . Codeine Nausea And Vomiting  . Penicillins Other (See Comments)    Childhood allergy    Immunization History  Administered Date(s) Administered  . Influenza Split 03/30/2014, 02/28/2016  . Influenza,inj,Quad PF,6+ Mos 04/28/2017  . Tdap 08/02/2016  . Zoster 08/02/2016    Past Medical History:  Diagnosis Date  . Allergic rhinitis   . Hypertension   . Hypothyroidism   . Pulmonary embolism (HCC)     Tobacco History: Social History   Tobacco Use  Smoking Status Never Smoker  Smokeless  Tobacco Never Used   Counseling given: Not Answered   Outpatient Medications Prior to Visit  Medication Sig Dispense Refill  . levocetirizine (XYZAL) 5 MG tablet Take 1 tablet (5 mg total) by mouth daily. 90 tablet 3  . levothyroxine (SYNTHROID, LEVOTHROID) 125 MCG tablet Take 1 tablet (125 mcg total) by mouth daily. 90 tablet 2  . lisinopril (PRINIVIL,ZESTRIL) 20 MG tablet Take 1 tablet (20 mg total) by mouth daily. 90 tablet 1  . rivaroxaban (XARELTO) 20 MG TABS tablet TAKE ONE TABLET BY MOUTH ONCE DAILY WITH SUPPER 30 tablet 0   No facility-administered medications prior to visit.      Review of Systems:   Constitutional:   No  weight loss, night sweats,  Fevers, chills, fatigue, or  lassitude.  HEENT:   No headaches,  Difficulty swallowing,  Tooth/dental problems, or  Sore throat,                No sneezing, itching, ear ache, nasal congestion, post nasal drip,   CV:  No chest pain,  Orthopnea, PND, swelling in lower extremities, anasarca, dizziness, palpitations, syncope.   GI  No heartburn, indigestion, abdominal pain, nausea, vomiting, diarrhea, change in bowel habits, loss of appetite, bloody stools.   Resp: No shortness of breath with exertion or at rest.  No excess mucus, no productive cough,  No non-productive cough,  No coughing up of blood.  No change in color of mucus.  No wheezing.  No chest wall  deformity  Skin: no rash or lesions.  GU: no dysuria, change in color of urine, no urgency or frequency.  No flank pain, no hematuria   MS:  No joint pain or swelling.  No decreased range of motion.  No back pain.    Physical Exam  BP 140/78 (BP Location: Left Arm, Cuff Size: Normal)   Pulse 65   Ht 5\' 7"  (1.702 m)   Wt 198 lb 9.6 oz (90.1 kg)   SpO2 98%   BMI 31.11 kg/m   GEN: A/Ox3; pleasant , NAD, well nourished    HEENT:  [place]/AT,  EACs-clear, TMs-wnl, NOSE-clear, THROAT-clear, no lesions, no postnasal drip or exudate noted.   NECK:  Supple w/ fair ROM; no  JVD; normal carotid impulses w/o bruits; no thyromegaly or nodules palpated; no lymphadenopathy.    RESP  Clear  P & A; w/o, wheezes/ rales/ or rhonchi. no accessory muscle use, no dullness to percussion  CARD:  RRR, no m/r/g, no peripheral edema, pulses intact, no cyanosis or clubbing.  GI:   Soft & nt; nml bowel sounds; no organomegaly or masses detected.   Musco: Warm bil, no deformities or joint swelling noted.   Neuro: alert, no focal deficits noted.    Skin: Warm, no lesions or rashes    Lab Results:  CBC    Component Value Date/Time   WBC 5.8 12/15/2017 0802   RBC 4.31 12/15/2017 0802   HGB 13.5 12/15/2017 0802   HCT 40.1 12/15/2017 0802   PLT 265 12/15/2017 0802   MCV 93.0 12/15/2017 0802   MCH 31.3 12/15/2017 0802   MCHC 33.7 12/15/2017 0802   RDW 11.9 12/15/2017 0802   LYMPHSABS 1,264 12/15/2017 0802   MONOABS 640 08/02/2016 0910   EOSABS 336 12/15/2017 0802   BASOSABS 110 12/15/2017 0802    BMET    Component Value Date/Time   NA 139 12/15/2017 0802   K 4.6 12/15/2017 0802   CL 108 12/15/2017 0802   CO2 24 12/15/2017 0802   GLUCOSE 95 12/15/2017 0802   BUN 20 12/15/2017 0802   CREATININE 1.25 12/15/2017 0802   CALCIUM 9.1 12/15/2017 0802   GFRNONAA 61 12/15/2017 0802   GFRAA 71 12/15/2017 0802    BNP  ProBNP No results found for: PROBNP  Imaging: No results found.    No flowsheet data found.  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  Continue on Xarelto .   Follow up Dr. Delton Coombes  In  1 year and as needed   Please contact office for sooner follow up if symptoms do not improve or worsen or seek emergency care          Rubye Oaks, NP 06/11/2018

## 2018-06-11 NOTE — Patient Instructions (Signed)
Avoid NSAIDS (advil, ibuprofen, aleve,naproxen , excedrin, etc)  These can cause bleeding.  Continue on Xarelto .   Follow up Dr. Byrum  In  1 year and as needed   Please contact office for sooner follow up if symptoms do not improve or worsen or seek emergency care   

## 2018-06-11 NOTE — Addendum Note (Signed)
Addended by: Boone Master E on: 06/11/2018 04:58 PM   Modules accepted: Orders

## 2018-06-11 NOTE — Assessment & Plan Note (Signed)
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  Continue on Xarelto .   Follow up Dr. Delton Coombes  In  1 year and as needed   Please contact office for sooner follow up if symptoms do not improve or worsen or seek emergency care

## 2018-09-03 ENCOUNTER — Telehealth: Payer: Self-pay | Admitting: Family Medicine

## 2018-09-03 DIAGNOSIS — L989 Disorder of the skin and subcutaneous tissue, unspecified: Secondary | ICD-10-CM

## 2018-09-03 NOTE — Telephone Encounter (Signed)
Patient called in today requesting a referral to Dermatology for some lesions on the back of his hand. He was last seen for skin issues in January 2018. May I place this referral or would you like to see patient first. Please advise?

## 2018-09-03 NOTE — Telephone Encounter (Signed)
Ok with referral. 

## 2018-09-06 NOTE — Telephone Encounter (Signed)
Referral orders placed

## 2018-11-13 ENCOUNTER — Other Ambulatory Visit: Payer: Self-pay | Admitting: Family Medicine

## 2018-12-10 ENCOUNTER — Other Ambulatory Visit: Payer: Self-pay

## 2018-12-10 ENCOUNTER — Encounter: Payer: Self-pay | Admitting: Family Medicine

## 2018-12-10 ENCOUNTER — Ambulatory Visit: Payer: 59 | Admitting: Family Medicine

## 2018-12-10 VITALS — BP 150/108 | HR 88 | Temp 98.6°F | Resp 16 | Ht 68.0 in | Wt 194.0 lb

## 2018-12-10 DIAGNOSIS — L600 Ingrowing nail: Secondary | ICD-10-CM

## 2018-12-11 NOTE — Progress Notes (Signed)
Subjective:    Patient ID: Edgar Miller, male    DOB: [birth year], 63 y.o.   MRN: [id number]  HPI Patient scheduled his appointment today due to bilateral ingrown toenails.  Patient complains of chronic pain in the great toes of both feet.  On examination, the medial portion of the right great toenail curves down almost at an 80 degree angle.  It also turns slightly lateral and wraps into the underlying skin.  There is no erythema.  There is no fluctuance.  There is no evidence of secondary infection although it is tender to palpation.  His right great toenail is also ingrown medially.  It curves downward at an 80 degree angle.  It does not wrap under the adjacent tissue.  There is no evidence of erythema.  There is no fluctuance or pus. Past Medical History:  Diagnosis Date  . Allergic rhinitis   . Hypertension   . Hypothyroidism   . Pulmonary embolism [facility](HCC)    Past Surgical History:  Procedure Laterality Date  . ANKLE SURGERY Right   . Heart ablation    . KNEE ARTHROSCOPY WITH MENISCAL REPAIR    . THUMB ARTHROSCOPY Left    pins   Current Outpatient Medications on File Prior to Visit  Medication Sig Dispense Refill  . levocetirizine (XYZAL) 5 MG tablet Take 1 tablet (5 mg total) by mouth daily. 90 tablet 3  . levothyroxine (SYNTHROID, LEVOTHROID) 125 MCG tablet Take 1 tablet (125 mcg total) by mouth daily. 90 tablet 2  . lisinopril (ZESTRIL) 20 MG tablet TAKE 1 TABLET BY MOUTH EVERY DAY 90 tablet 0  . rivaroxaban (XARELTO) 20 MG TABS tablet TAKE ONE TABLET BY MOUTH ONCE DAILY WITH SUPPER 90 tablet 1   No current facility-administered medications on file prior to visit.    Allergies  Allergen Reactions  . Codeine Nausea And Vomiting  . Penicillins Other (See Comments)    Childhood allergy   Social History   Socioeconomic History  . Marital status: Married    Spouse name: Not on file  . Number of children: 5  . Years of education: Not on file  . Highest education level:  Not on file  Occupational History  . Occupation: chief Radiographer, therapeuticfinancial officer  Social Needs  . Financial resource strain: Not on file  . Food insecurity    Worry: Not on file    Inability: Not on file  . Transportation needs    Medical: Not on file    Non-medical: Not on file  Tobacco Use  . Smoking status: Never Smoker  . Smokeless tobacco: Never Used  Substance and Sexual Activity  . Alcohol use: No  . Drug use: No  . Sexual activity: Yes  Lifestyle  . Physical activity    Days per week: Not on file    Minutes per session: Not on file  . Stress: Not on file  Relationships  . Social Musicianconnections    Talks on phone: Not on file    Gets together: Not on file    Attends religious service: Not on file    Active member of club or organization: Not on file    Attends meetings of clubs or organizations: Not on file    Relationship status: Not on file  . Intimate partner violence    Fear of current or ex partner: Not on file    Emotionally abused: Not on file    Physically abused: Not on file    Forced sexual activity:  Not on file  Other Topics Concern  . Not on file  Social History Narrative  . Not on file      Review of Systems  All other systems reviewed and are negative.      Objective:   Physical Exam Vitals signs reviewed.  Constitutional:      Appearance: Normal appearance. He is normal weight.  Cardiovascular:     Rate and Rhythm: Normal rate and regular rhythm.  Pulmonary:     Effort: Pulmonary effort is normal.     Breath sounds: Normal breath sounds.  Musculoskeletal:     Right foot: Tenderness and deformity present. No bony tenderness or swelling.       Feet:  Neurological:     Mental Status: He is alert.           Assessment & Plan:  Bilateral ingrown great toenails.  We discussed treatment options including: #1 soaking his toes every night and warm Epson salt water and then using dental floss to try to separate the ingrown portion of the  nail from the underlying nailbed while allowing the toenails to gradually grow proud of the distal skin.  He would then cut the toenails across in a linear fashion rather than on the curve.  2.  Removing the ingrown portion of the toenails today and allowing it to grow back in  3.  Removing the ingrown portion of the toenails today and using phenol to try to destroy the underlying nail matrix to prevent the toenails from regrowing.  After long discussion, patient elects to try #1.  If this is unsuccessful he will return to remove the ingrown portion of the toenails.

## 2018-12-21 ENCOUNTER — Other Ambulatory Visit: Payer: Self-pay | Admitting: Adult Health

## 2018-12-22 ENCOUNTER — Other Ambulatory Visit: Payer: Self-pay | Admitting: Family Medicine

## 2018-12-26 NOTE — Telephone Encounter (Signed)
Avoid NSAIDS (advil, ibuprofen, aleve,naproxen , excedrin, etc)  These can cause bleeding.  Continue on Xarelto .   Follow up Dr. Lamonte Sakai  In  1 year and as needed   Please contact office for sooner follow up if symptoms do not improve or worsen or seek emergency care

## 2019-03-29 ENCOUNTER — Other Ambulatory Visit: Payer: Self-pay | Admitting: Family Medicine

## 2019-06-26 ENCOUNTER — Other Ambulatory Visit: Payer: Self-pay | Admitting: Adult Health

## 2019-09-18 ENCOUNTER — Other Ambulatory Visit: Payer: Self-pay | Admitting: Adult Health

## 2019-10-01 ENCOUNTER — Telehealth: Payer: Self-pay | Admitting: Adult Health

## 2019-10-01 MED ORDER — RIVAROXABAN 20 MG PO TABS: ORAL_TABLET | ORAL | 0 refills | Status: DC

## 2019-10-01 NOTE — Telephone Encounter (Signed)
Called and spoke with Patient.  Patient requested a new prescription of Xarelto to be sent to [facility].  Patient requested a 90 day supply, and has follow up scheduled 10/07/19. Requested prescription sent to pharmacy.  Nothing further at this time.

## 2019-10-04 ENCOUNTER — Ambulatory Visit: Payer: 59 | Admitting: Adult Health

## 2019-10-07 ENCOUNTER — Encounter: Payer: 59 | Admitting: Adult Health

## 2019-10-07 ENCOUNTER — Encounter: Payer: Self-pay | Admitting: Adult Health

## 2019-10-07 ENCOUNTER — Ambulatory Visit (INDEPENDENT_AMBULATORY_CARE_PROVIDER_SITE_OTHER): Payer: 59 | Admitting: Adult Health

## 2019-10-07 DIAGNOSIS — I2699 Other pulmonary embolism without acute cor pulmonale: Secondary | ICD-10-CM | POA: Diagnosis not present

## 2019-10-07 NOTE — Progress Notes (Signed)
@Patient  ID: , male    DOB: 01-15-1956, 64 y.o.   MRN: [id number]  Chief Complaint  Patient presents with  . Follow-up    PE    Referring provider: [id number], MD  HPI: 64 year old male followed for recurrent PE.  Patient was diagnosed with PE and right lower extremity DVT in March 2016. (2nd PE )  Irregular heartbeat with past ablation 2007 done in 2008  TEST/EVENTS :  Was admitted March 17 to March 192016 for an acute pulmonary embolism and right lower extremity DVT. CT angiogram of the chest showed pulmonary embolism involving bilateral pulmonary arteries. Venous Doppler showed a positive right lower extremity DVT-occlusive DVT with the proximal posterior tibial vein. This was patient's second PE. He had extensive travel back from [place].  2-D echo showed an EF of 55-60%, right ventricle moderately dilated., PAP 49 CT chest 02/2015 neg PE  Echo 02/2015 EF nml , PAP nml, mild TV regurg    10/07/2019 Follow up : PE  Patient presents for a 1 year follow-up.  Last seen in January 2020.  Patient is followed for recurrent PE and DVT in the past and is on lifelong anticoagulation with Xarelto.  Patient says overall he is doing very well.  Has no known issues.  Says he is very active he plays tennis and golf.  He denies any known bleeding.  Denies any significant shortness of breath. Has received both Covid vaccines.    Allergies  Allergen Reactions  . Codeine Nausea And Vomiting  . Penicillins Other (See Comments)    Childhood allergy    Immunization History  Administered Date(s) Administered  . Influenza Split 03/30/2014, 02/28/2016  . Influenza,inj,Quad PF,6+ Mos 04/28/2017  . Tdap 08/02/2016  . Zoster 08/02/2016    Past Medical History:  Diagnosis Date  . Allergic rhinitis   . Hypertension   . Hypothyroidism   . Pulmonary embolism (HCC)     Tobacco History: Social History   Tobacco Use  Smoking Status Never  Smoker  Smokeless Tobacco Never Used   Counseling given: Not Answered   Outpatient Medications Prior to Visit  Medication Sig Dispense Refill  . levocetirizine (XYZAL) 5 MG tablet TAKE 1 TABLET BY MOUTH EVERY DAY 90 tablet 3  . levothyroxine (SYNTHROID) 125 MCG tablet TAKE 1 TABLET BY MOUTH EVERY DAY 90 tablet 2  . lisinopril (ZESTRIL) 20 MG tablet TAKE 1 TABLET BY MOUTH EVERY DAY 90 tablet 2  . rivaroxaban (XARELTO) 20 MG TABS tablet TAKE ONE TABLET BY MOUTH ONCE DAILY WITH SUPPER 90 tablet 0   No facility-administered medications prior to visit.     Review of Systems:   Constitutional:   No  weight loss, night sweats,  Fevers, chills, fatigue, or  lassitude.  HEENT:   No headaches,  Difficulty swallowing,  Tooth/dental problems, or  Sore throat,                No sneezing, itching, ear ache, nasal congestion, post nasal drip,   CV:  No chest pain,  Orthopnea, PND, swelling in lower extremities, anasarca, dizziness, palpitations, syncope.   GI  No heartburn, indigestion, abdominal pain, nausea, vomiting, diarrhea, change in bowel habits, loss of appetite, bloody stools.   Resp: No shortness of breath with exertion or at rest.  No excess mucus, no productive cough,  No non-productive cough,  No coughing up of blood.  No change in color of mucus.  No wheezing.  No chest wall  deformity  Skin: no rash or lesions.  GU: no dysuria, change in color of urine, no urgency or frequency.  No flank pain, no hematuria   MS:  No joint pain or swelling.  No decreased range of motion.  No back pain.    Physical Exam  BP 140/90 (BP Location: Left Arm, Patient Position: Sitting, Cuff Size: Normal)   Pulse 70   Temp 97.9 F (36.6 C) (Temporal)   Ht 5\' 8"  (1.727 m)   Wt 193 lb 9.6 oz (87.8 kg)   SpO2 99%   BMI 29.44 kg/m   GEN: A/Ox3; pleasant , NAD, well nourished    HEENT:  [place]/AT,  EACs-clear, TMs-wnl, NOSE-clear, THROAT-clear, no lesions, no postnasal drip or exudate noted.    NECK:  Supple w/ fair ROM; no JVD; normal carotid impulses w/o bruits; no thyromegaly or nodules palpated; no lymphadenopathy.    RESP  Clear  P & A; w/o, wheezes/ rales/ or rhonchi. no accessory muscle use, no dullness to percussion  CARD:  RRR, no m/r/g, no peripheral edema, pulses intact, no cyanosis or clubbing.  GI:   Soft & nt; nml bowel sounds; no organomegaly or masses detected.   Musco: Warm bil, no deformities or joint swelling noted.   Neuro: alert, no focal deficits noted.    Skin: Warm, no lesions or rashes    Lab Results:    ProBNP No results found for: PROBNP  Imaging: No results found.    No flowsheet data found.  No results found for: NITRICOXIDE      Assessment & Plan:   Pulmonary emboli (HCC) Recurrent PE on lifelong anticoagulation.  Continue on Xarelto. Patient sees his primary care provider on a routine basis.  Would recommend yearly CBCs.  Continue device on avoidance of nonsteroidals.  Patient does not use these and is aware of the potential complications while on anticoagulation.  Plan  Patient Instructions  Avoid NSAIDS (advil, ibuprofen, aleve,naproxen , excedrin, etc)  These can cause bleeding.  Continue on Xarelto 20mg  daily  Follow up Dr. Lamonte Sakai or Delaney Schnick NP   In  1 year and as needed   Please contact office for sooner follow up if symptoms do not improve or worsen or seek emergency care          Rexene Edison, NP 10/07/2019

## 2019-10-07 NOTE — Assessment & Plan Note (Signed)
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  Continue on Xarelto. Patient sees his primary care provider on a routine basis.  Would recommend yearly CBCs.  Continue device on avoidance of nonsteroidals.  Patient does not use these and is aware of the potential complications while on anticoagulation.  Plan  Patient Instructions  Avoid NSAIDS (advil, ibuprofen, aleve,naproxen , excedrin, etc)  These can cause bleeding.  Continue on Xarelto 20mg  daily  Follow up Dr. or Aryani Daffern NP   In  1 year and as needed   Please contact office for sooner follow up if symptoms do not improve or worsen or seek emergency care

## 2019-10-07 NOTE — Patient Instructions (Addendum)
Avoid NSAIDS (advil, ibuprofen, aleve,naproxen , excedrin, etc)  These can cause bleeding.  Continue on Xarelto 20mg daily  Follow up Dr. Byrum or Velina Drollinger NP   In  1 year and as needed   Please contact office for sooner follow up if symptoms do not improve or worsen or seek emergency care  

## 2019-10-08 ENCOUNTER — Ambulatory Visit: Payer: 59 | Admitting: Adult Health

## 2019-10-23 ENCOUNTER — Other Ambulatory Visit: Payer: Self-pay | Admitting: Family Medicine

## 2019-12-25 ENCOUNTER — Other Ambulatory Visit: Payer: Self-pay | Admitting: Family Medicine

## 2019-12-25 ENCOUNTER — Other Ambulatory Visit: Payer: Self-pay | Admitting: Adult Health

## 2020-02-21 DIAGNOSIS — L570 Actinic keratosis: Secondary | ICD-10-CM | POA: Diagnosis not present

## 2020-02-21 DIAGNOSIS — L821 Other seborrheic keratosis: Secondary | ICD-10-CM | POA: Diagnosis not present

## 2020-03-03 ENCOUNTER — Other Ambulatory Visit: Payer: Self-pay

## 2020-03-03 ENCOUNTER — Ambulatory Visit (INDEPENDENT_AMBULATORY_CARE_PROVIDER_SITE_OTHER): Payer: BC Managed Care – PPO | Admitting: Family Medicine

## 2020-03-03 VITALS — BP 120/90 | HR 74 | Temp 97.3°F | Ht 68.0 in | Wt 193.0 lb

## 2020-03-03 DIAGNOSIS — Z0000 Encounter for general adult medical examination without abnormal findings: Secondary | ICD-10-CM

## 2020-03-03 DIAGNOSIS — I10 Essential (primary) hypertension: Secondary | ICD-10-CM

## 2020-03-03 DIAGNOSIS — Z0001 Encounter for general adult medical examination with abnormal findings: Secondary | ICD-10-CM | POA: Diagnosis not present

## 2020-03-03 DIAGNOSIS — Z23 Encounter for immunization: Secondary | ICD-10-CM

## 2020-03-03 DIAGNOSIS — E039 Hypothyroidism, unspecified: Secondary | ICD-10-CM | POA: Diagnosis not present

## 2020-03-03 NOTE — Progress Notes (Signed)
Subjective:    Patient ID: Edgar Miller, male    DOB: 18-Sep-1955, 64 y.o.   MRN: [id number]  HPI Patient is a very pleasant 64 year old white male here today for complete physical exam. Past medical history significant for hypothyroidism, essential hypertension, and also DVT/PE 2. The first time he had a DVT was after prolonged car rides back and forth to [place]. After prolonged immobility, he developed a DVT in his left leg he believes with resultant pulmonary embolism. 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 After that decision he decided to remain on lifelong anticoagulation.  Patient is overdue for a colonoscopy.  He is also due for prostate cancer screening.  He declines a colonoscopy.  He would consent to Cologuard screening.  He is overdue for a PSA.  He is due for a flu shot today.  He has had a shingles vaccine.  He has had the Moderna Covid vaccine Immunization History  Administered Date(s) Administered  . Influenza Split 03/30/2014, 02/28/2016  . Influenza,inj,Quad PF,6+ Mos 04/28/2017  . Tdap 08/02/2016  . Zoster 08/02/2016    Past Medical History:  Diagnosis Date  . Allergic rhinitis   . Allergy    [facility]reesia 03/02/2020  . Clotting disorder (HCC)    [facility]reesia 03/02/2020  . Hypertension   . Hypothyroidism   . Pulmonary embolism (HCC)   . Thyroid disease    [facility]reesia 03/02/2020   Past Surgical History:  Procedure Laterality Date  . ANKLE SURGERY Right   . Heart ablation    . KNEE ARTHROSCOPY WITH MENISCAL REPAIR    . THUMB ARTHROSCOPY Left    pins   Current Outpatient Medications on File Prior to Visit  Medication Sig Dispense Refill  . levocetirizine (XYZAL) 5 MG tablet TAKE 1 TABLET BY MOUTH EVERY DAY 90 tablet 3  . levothyroxine (SYNTHROID) 125 MCG tablet TAKE 1 TABLET BY MOUTH EVERY DAY 90 tablet 2  . lisinopril (ZESTRIL) 20 MG tablet TAKE 1 TABLET BY MOUTH EVERY DAY  90 tablet 2  . XARELTO 20 MG TABS tablet TAKE ONE TABLET BY MOUTH ONCE DAILY WITH SUPPER 90 tablet 1   No current facility-administered medications on file prior to visit.   Allergies  Allergen Reactions  . Codeine Nausea And Vomiting  . Penicillins Other (See Comments)    Childhood allergy   Social History   Socioeconomic History  . Marital status: Married    Spouse name: Not on file  . Number of children: 5  . Years of education: Not on file  . Highest education level: Not on file  Occupational History  . Occupation: chief Radiographer, therapeutic  Tobacco Use  . Smoking status: Never Smoker  . Smokeless tobacco: Never Used  Vaping Use  . Vaping Use: Never used  Substance and Sexual Activity  . Alcohol use: No  . Drug use: No  . Sexual activity: Yes  Other Topics Concern  . Not on file  Social History Narrative  . Not on file   Social Determinants of Health   Financial Resource Strain:   . Difficulty of Paying Living Expenses: Not on file  Food Insecurity:   . Worried About Programme researcher, broadcasting/film/video in the Last Year: Not on file  . Ran Out of Food in the Last Year: Not on file  Transportation Needs:   . Lack of Transportation (Medical): Not on file  . Lack of Transportation (Non-Medical): Not on  file  Physical Activity:   . Days of Exercise per Week: Not on file  . Minutes of Exercise per Session: Not on file  Stress:   . Feeling of Stress : Not on file  Social Connections:   . Frequency of Communication with Friends and Family: Not on file  . Frequency of Social Gatherings with Friends and Family: Not on file  . Attends Religious Services: Not on file  . Active Member of Clubs or Organizations: Not on file  . Attends Banker Meetings: Not on file  . Marital Status: Not on file  Intimate Partner Violence:   . Fear of Current or Ex-Partner: Not on file  . Emotionally Abused: Not on file  . Physically Abused: Not on file  . Sexually Abused: Not on file      Family History  Problem Relation Age of Onset  . Stroke Mother   . Hypothyroidism Mother   . Heart failure Father   . Atrial fibrillation Father   . Prostate cancer Paternal Grandfather   . Bladder Cancer Paternal Grandfather   . Kidney disease Maternal Grandfather     Review of Systems  All other systems reviewed and are negative.      Objective:   Physical Exam Vitals reviewed.  Constitutional:      General: He is not in acute distress.    Appearance: He is well-developed. He is not diaphoretic.  HENT:     Head: Normocephalic and atraumatic.     Right Ear: External ear normal.     Left Ear: External ear normal.     Nose: Nose normal.     Mouth/Throat:     Pharynx: No oropharyngeal exudate.  Eyes:     General: No scleral icterus.       Right eye: No discharge.        Left eye: No discharge.     Conjunctiva/sclera: Conjunctivae normal.     Pupils: Pupils are equal, round, and reactive to light.  Neck:     Thyroid: No thyromegaly.     Vascular: No JVD.     Trachea: No tracheal deviation.  Cardiovascular:     Rate and Rhythm: Normal rate and regular rhythm.     Heart sounds: Normal heart sounds. No murmur heard.  No friction rub. No gallop.   Pulmonary:     Effort: Pulmonary effort is normal. No respiratory distress.     Breath sounds: Normal breath sounds. No stridor. No wheezing or rales.  Chest:     Chest wall: No tenderness.  Abdominal:     General: Bowel sounds are normal. There is no distension.     Palpations: Abdomen is soft. There is no mass.     Tenderness: There is no abdominal tenderness. There is no rebound.  Musculoskeletal:        General: No tenderness or deformity. Normal range of motion.     Cervical back: Normal range of motion and neck supple.  Lymphadenopathy:     Cervical: No cervical adenopathy.  Skin:    General: Skin is warm.     Coloration: Skin is not pale.     Findings: No erythema or rash.  Neurological:     Mental  Status: He is alert and oriented to person, place, and time.     Cranial Nerves: No cranial nerve deficit.     Motor: No abnormal muscle tone.     Coordination: Coordination normal.     Deep Tendon  Reflexes: Reflexes are normal and symmetric.  Psychiatric:        Behavior: Behavior normal.        Thought Content: Thought content normal.        Judgment: Judgment normal.           Assessment & Plan:  Annual physical exam - Plan: CBC with Differential/Platelet, COMPLETE METABOLIC PANEL WITH GFR, Lipid panel, PSA  Essential hypertension - Plan: CBC with Differential/Platelet, COMPLETE METABOLIC PANEL WITH GFR, Lipid panel  Hypothyroidism, unspecified type - Plan: TSH Patient's blood pressure is well controlled today on his exam.  Recommended a colonoscopy but ultimately the patient consented to Cologuard.  Screen for prostate cancer with a PSA.  Check a CBC, CMP, fasting lipid panel.  Patient received his flu shot today.  Patient will be due for Pneumovax 23 next year.  Regular anticipatory guidance is provided.  Monitor management of his hypothyroidism with a TSH.

## 2020-03-04 LAB — PSA: PSA: 0.39 ng/mL (ref <=4.0)

## 2020-03-04 LAB — COMPLETE METABOLIC PANEL WITH GFR
AG Ratio: 1.4 (calc) (ref 1.0–2.5)
ALT: 15 U/L (ref 9–46)
AST: 17 U/L (ref 10–35)
Albumin: 3.8 g/dL (ref 3.6–5.1)
Alkaline phosphatase (APISO): 57 U/L (ref 35–144)
BUN/Creatinine Ratio: 27 (calc) — ABNORMAL HIGH (ref 6–22)
BUN: 31 mg/dL — ABNORMAL HIGH (ref 7–25)
CO2: 24 mmol/L (ref 20–32)
Calcium: 9.1 mg/dL (ref 8.6–10.3)
Chloride: 108 mmol/L (ref 98–110)
Creat: 1.15 mg/dL (ref 0.70–1.25)
GFR, Est African American: 78 mL/min/{1.73_m2} (ref >=60)
GFR, Est Non African American: 67 mL/min/{1.73_m2} (ref >=60)
Globulin: 2.8 g/dL (calc) (ref 1.9–3.7)
Glucose, Bld: 90 mg/dL (ref 65–99)
Potassium: 4.6 mmol/L (ref 3.5–5.3)
Sodium: 138 mmol/L (ref 135–146)
Total Bilirubin: 0.5 mg/dL (ref 0.2–1.2)
Total Protein: 6.6 g/dL (ref 6.1–8.1)

## 2020-03-04 LAB — CBC WITH DIFFERENTIAL/PLATELET
Absolute Monocytes: 546 cells/uL (ref 200–950)
Basophils Absolute: 111 cells/uL (ref 0–200)
Basophils Relative: 2.1 %
Eosinophils Absolute: 376 cells/uL (ref 15–500)
Eosinophils Relative: 7.1 %
HCT: 41.6 % (ref 38.5–50.0)
Hemoglobin: 13.9 g/dL (ref 13.2–17.1)
Lymphs Abs: 1516 cells/uL (ref 850–3900)
MCH: 31.4 pg (ref 27.0–33.0)
MCHC: 33.4 g/dL (ref 32.0–36.0)
MCV: 94.1 fL (ref 80.0–100.0)
MPV: 10.4 fL (ref 7.5–12.5)
Monocytes Relative: 10.3 %
Neutro Abs: 2751 cells/uL (ref 1500–7800)
Neutrophils Relative %: 51.9 %
Platelets: 264 10*3/uL (ref 140–400)
RBC: 4.42 10*6/uL (ref 4.20–5.80)
RDW: 12.1 % (ref 11.0–15.0)
Total Lymphocyte: 28.6 %
WBC: 5.3 10*3/uL (ref 3.8–10.8)

## 2020-03-04 LAB — LIPID PANEL
Cholesterol: 177 mg/dL (ref <200)
HDL: 44 mg/dL (ref >=40)
LDL Cholesterol (Calc): 117 mg/dL (calc) — ABNORMAL HIGH
Non-HDL Cholesterol (Calc): 133 mg/dL (calc) — ABNORMAL HIGH (ref <130)
Total CHOL/HDL Ratio: 4 (calc) (ref <5.0)
Triglycerides: 67 mg/dL (ref <150)

## 2020-03-04 LAB — TSH: TSH: 3.91 mIU/L (ref 0.40–4.50)

## 2020-03-25 ENCOUNTER — Other Ambulatory Visit: Payer: Self-pay | Admitting: Family Medicine

## 2020-04-13 DIAGNOSIS — R5383 Other fatigue: Secondary | ICD-10-CM | POA: Diagnosis not present

## 2020-04-13 DIAGNOSIS — R0689 Other abnormalities of breathing: Secondary | ICD-10-CM | POA: Diagnosis not present

## 2020-04-13 DIAGNOSIS — R0683 Snoring: Secondary | ICD-10-CM | POA: Diagnosis not present

## 2020-04-21 DIAGNOSIS — G4733 Obstructive sleep apnea (adult) (pediatric): Secondary | ICD-10-CM | POA: Diagnosis not present

## 2020-06-29 ENCOUNTER — Other Ambulatory Visit: Payer: Self-pay | Admitting: Adult Health

## 2020-07-29 ENCOUNTER — Other Ambulatory Visit: Payer: Self-pay | Admitting: Family Medicine

## 2020-09-29 ENCOUNTER — Other Ambulatory Visit: Payer: Self-pay | Admitting: *Deleted

## 2020-09-29 MED ORDER — LEVOTHYROXINE SODIUM 125 MCG PO TABS: 125.0000 ug | ORAL_TABLET | Freq: Every day | ORAL | 0 refills | Status: DC

## 2020-10-09 ENCOUNTER — Encounter: Payer: Self-pay | Admitting: Adult Health

## 2020-10-09 ENCOUNTER — Ambulatory Visit: Payer: 59 | Admitting: Adult Health

## 2020-10-09 ENCOUNTER — Ambulatory Visit: Payer: BC Managed Care – PPO | Admitting: Adult Health

## 2020-10-09 ENCOUNTER — Other Ambulatory Visit: Payer: Self-pay

## 2020-10-09 DIAGNOSIS — I2699 Other pulmonary embolism without acute cor pulmonale: Secondary | ICD-10-CM

## 2020-10-09 MED ORDER — RIVAROXABAN 20 MG PO TABS: ORAL_TABLET | ORAL | 4 refills | Status: DC

## 2020-10-09 NOTE — Patient Instructions (Signed)
Avoid NSAIDS (advil, ibuprofen, aleve,naproxen , excedrin, etc)  These can cause bleeding.  Continue on Xarelto 20mg daily  Follow up Dr. Byrum or Dena Esperanza NP   In  1 year and as needed   Please contact office for sooner follow up if symptoms do not improve or worsen or seek emergency care  

## 2020-10-09 NOTE — Progress Notes (Signed)
@Patient  ID: , male    DOB: 1956-01-24, 65 y.o.   MRN: [id number]  Chief Complaint  Patient presents with  . Follow-up    Referring provider: [id number], MD  HPI: 65 year old male followed for recurrent PE.  Patient was diagnosed with pulmonary embolism and right lower DVT in March 2016.  Which was his second pulmonary emboli.  He is on lifelong anticoagulation. 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  TEST/EVENTS :  Was admitted March 17 to March 192016 for an acute pulmonary embolism and right lower extremity DVT. CT angiogram of the chest showed pulmonary embolism involving bilateral pulmonary arteries. Venous Doppler showed a positive right lower extremity DVT-occlusive DVT with the proximal posterior tibial vein. This was patient's second PE. He had extensive travel back from [place] recently.  2-D echo showed an EF of 55-60%, right ventricle moderately dilated., PAP 49 CT chest 02/2015 neg PE  Echo 02/2015 EF nml , PAP nml, mild TV regurg ANA and prothrombin gene mutation was negative. Sedimentation rate was 18.  10/09/2020 Follow up : Recurrent PE  Patient returns for a 1 year follow-up.  Patient is followed for recurrent PE and DVT.  Patient had a second PE and DVT in 2016.  He is on lifelong anticoagulation with Xarelto.  Previous echo showed right ventricle moderate dilation.  With elevated pulmonary artery pressure.  Repeat echo October 2016 showed normal pulmonary artery pressure and RV size. Patient says he remains very active.  He works full-time.  He has his own business.  He plays golf and tennis.  He denies any known bleeding.  CBC in October was normal with no signs of anemia.  He denies any significant shortness of breath.  Patient has received all of his COVID vaccines including booster.  He denies any cough chest pain  orthopnea PND or increased leg swelling.   Allergies  Allergen Reactions  . Codeine Nausea And Vomiting  . Penicillins Other (See Comments)    Childhood allergy    Immunization History  Administered Date(s) Administered  . Influenza Split 03/30/2014, 02/28/2016  . Influenza,inj,Quad PF,6+ Mos 04/28/2017, 03/03/2020  . Tdap 08/02/2016  . Zoster 08/02/2016    Past Medical History:  Diagnosis Date  . Allergic rhinitis   . Allergy    Phreesia 03/02/2020  . Clotting disorder (HCC)    [facility]reesia 03/02/2020  . Hypertension   . Hypothyroidism   . Pulmonary embolism (HCC)   . Thyroid disease    Phreesia 03/02/2020    Tobacco History: Social History   Tobacco Use  Smoking Status Never Smoker  Smokeless Tobacco Never Used   Counseling given: Not Answered   Outpatient Medications Prior to Visit  Medication Sig Dispense Refill  . levocetirizine (XYZAL) 5 MG tablet TAKE 1 TABLET BY MOUTH EVERY DAY 90 tablet 3  . levothyroxine (SYNTHROID) 125 MCG tablet Take 1 tablet (125 mcg total) by mouth daily. 90 tablet 0  . lisinopril (ZESTRIL) 20 MG tablet TAKE 1 TABLET BY MOUTH EVERY DAY 90 tablet 2  . XARELTO 20 MG TABS tablet TAKE ONE TABLET BY MOUTH ONCE DAILY WITH SUPPER 90 tablet 1   No facility-administered medications prior to visit.     Review of Systems:   Constitutional:   No  weight loss, night sweats,  Fevers, chills, fatigue, or  lassitude.  HEENT:   No headaches,  Difficulty swallowing,  Tooth/dental problems,  or  Sore throat,                No sneezing, itching, ear ache, nasal congestion, post nasal drip,   CV:  No chest pain,  Orthopnea, PND, swelling in lower extremities, anasarca, dizziness, palpitations, syncope.   GI  No heartburn, indigestion, abdominal pain, nausea, vomiting, diarrhea, change in bowel habits, loss of appetite, bloody stools.   Resp: No shortness of breath with exertion or at rest.  No excess mucus, no productive cough,  No non-productive  cough,  No coughing up of blood.  No change in color of mucus.  No wheezing.  No chest wall deformity  Skin: no rash or lesions.  GU: no dysuria, change in color of urine, no urgency or frequency.  No flank pain, no hematuria   MS:  No joint pain or swelling.  No decreased range of motion.  No back pain.    Physical Exam  BP 130/80 (BP Location: Left Arm, Patient Position: Sitting, Cuff Size: Normal)   Pulse 78   Temp 97.6 F (36.4 C) (Temporal)   Ht 5\' 8"  (1.727 m)   Wt 190 lb 9.6 oz (86.5 kg)   SpO2 100%   BMI 28.98 kg/m   GEN: A/Ox3; pleasant , NAD, well nourished    HEENT:  [place]/AT,  , NOSE-clear, THROAT-clear, no lesions, no postnasal drip or exudate noted.   NECK:  Supple w/ fair ROM; no JVD; normal carotid impulses w/o bruits; no thyromegaly or nodules palpated; no lymphadenopathy.    RESP  Clear  P & A; w/o, wheezes/ rales/ or rhonchi. no accessory muscle use, no dullness to percussion  CARD:  RRR, no m/r/g, no peripheral edema, pulses intact, no cyanosis or clubbing.  GI:   Soft & nt; nml bowel sounds; no organomegaly or masses detected.   Musco: Warm bil, no deformities or joint swelling noted.   Neuro: alert, no focal deficits noted.    Skin: Warm, no lesions or rashes    Lab Results:  CBC    Component Value Date/Time   WBC 5.3 03/03/2020 0903   RBC 4.42 03/03/2020 0903   HGB 13.9 03/03/2020 0903   HCT 41.6 03/03/2020 0903   PLT 264 03/03/2020 0903   MCV 94.1 03/03/2020 0903   MCH 31.4 03/03/2020 0903   MCHC 33.4 03/03/2020 0903   RDW 12.1 03/03/2020 0903   LYMPHSABS 1,516 03/03/2020 0903   MONOABS 640 08/02/2016 0910   EOSABS 376 03/03/2020 0903   BASOSABS 111 03/03/2020 0903    BMET    Component Value Date/Time   NA 138 03/03/2020 0903   K 4.6 03/03/2020 0903   CL 108 03/03/2020 0903   CO2 24 03/03/2020 0903   GLUCOSE 90 03/03/2020 0903   BUN 31 (H) 03/03/2020 0903   CREATININE 1.15 03/03/2020 0903   CALCIUM 9.1 03/03/2020 0903    GFRNONAA 67 03/03/2020 0903   GFRAA 78 03/03/2020 0903    BNP  ProBNP No results found for: PROBNP  Imaging: No results found.    No flowsheet data found.  No results found for: NITRICOXIDE      Assessment & Plan:   Pulmonary emboli (HCC) History of recurrent PE and DVT on lifelong anticoagulation.  Patient is doing well on Xarelto.  Patient is continue on current regimen.  Patient education on anticoagulation.  Plan  Patient Instructions  Avoid NSAIDS (advil, ibuprofen, aleve,naproxen , excedrin, etc)  These can cause bleeding.  Continue on Xarelto 20mg  daily  Follow up Dr. Delton Coombes or Haskel Dewalt NP   In  1 year and as needed   Please contact office for sooner follow up if symptoms do not improve or worsen or seek emergency care           Rubye Oaks, NP 10/09/2020

## 2020-10-09 NOTE — Assessment & Plan Note (Addendum)
History of recurrent PE and DVT on lifelong anticoagulation.  Patient is doing well on Xarelto.  Patient is continue on current regimen.  Patient education on anticoagulation.    Plan  Patient Instructions  Avoid NSAIDS (advil, ibuprofen, aleve,naproxen , excedrin, etc)  These can cause bleeding.  Continue on Xarelto 20mg  daily  Follow up Dr. or Kenith Trickel NP   In  1 year and as needed   Please contact office for sooner follow up if symptoms do not improve or worsen or seek emergency care

## 2020-10-30 ENCOUNTER — Ambulatory Visit
Admission: RE | Admit: 2020-10-30 | Discharge: 2020-10-30 | Disposition: A | Discharge status: Home / Self Care | Payer: BC Managed Care – PPO | Source: Ambulatory Visit | Attending: Family Medicine | Admitting: Family Medicine

## 2020-10-30 ENCOUNTER — Ambulatory Visit: Payer: BC Managed Care – PPO | Admitting: Family Medicine

## 2020-10-30 ENCOUNTER — Other Ambulatory Visit: Payer: Self-pay

## 2020-10-30 ENCOUNTER — Encounter: Payer: Self-pay | Admitting: Family Medicine

## 2020-10-30 VITALS — BP 126/72 | HR 68 | Temp 98.7°F | Resp 14 | Ht 68.0 in | Wt 188.0 lb

## 2020-10-30 DIAGNOSIS — E039 Hypothyroidism, unspecified: Secondary | ICD-10-CM

## 2020-10-30 DIAGNOSIS — Z1211 Encounter for screening for malignant neoplasm of colon: Secondary | ICD-10-CM | POA: Diagnosis not present

## 2020-10-30 DIAGNOSIS — M25562 Pain in left knee: Secondary | ICD-10-CM

## 2020-10-30 DIAGNOSIS — I10 Essential (primary) hypertension: Secondary | ICD-10-CM

## 2020-10-30 DIAGNOSIS — Z23 Encounter for immunization: Secondary | ICD-10-CM | POA: Diagnosis not present

## 2020-10-30 DIAGNOSIS — G8929 Other chronic pain: Secondary | ICD-10-CM

## 2020-10-30 DIAGNOSIS — Z86718 Personal history of other venous thrombosis and embolism: Secondary | ICD-10-CM

## 2020-10-30 NOTE — Addendum Note (Signed)
Addended by: Phillips Odor on: 10/30/2020 09:03 AM   Modules accepted: Orders

## 2020-10-30 NOTE — Progress Notes (Signed)
Subjective:    Patient ID: Edgar Miller, male    DOB: 1956-05-27, 65 y.o.   MRN: [id number]  HPI Patient is a very pleasant 65 year old white male here today for a check up. Past medical history significant for hypothyroidism, essential hypertension, and also DVT/PE 2. The first time he had a DVT was after prolonged car rides back and forth to [place]. After prolonged immobility, he developed a DVT in his left leg he believes with resultant pulmonary embolism. 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 After that decision he decided to remain on lifelong anticoagulation.   In addition to just routine lab work, the patient has a few concerns.  First his last colonoscopy was when he was around age 65.  Therefore he is overdue for colon cancer screening.  He would like me to schedule him for a colonoscopy.  Second he has not had any pneumonia vaccine.  We discussed it today and he would like to receive Pneumovax 23.  30 is having almost chronic daily left knee pain.  He has a remote history of a meniscal tear in his left knee on the medial side several years ago.  He compensated for it and has been able to be relatively active.  However over the last several months, he has developed daily pain in the lateral aspect of his knee.  He states that he recently went to the beach and walking on uneven sand truly exacerbated this.  Patient states that he was unable to find any comfortable position in which to sleep due to the aching pain in his knee.  Today on exam, there is some tenderness to palpation over the lateral joint line.  There is no effusion.  There is no erythema.  There is no warmth.  There is no laxity.  He denies any locking or instability in the knee   Past Medical History:  Diagnosis Date  . Allergic rhinitis   . Allergy    [facility]reesia 03/02/2020  . Clotting disorder (HCC)    [facility]reesia 03/02/2020  .  Hypertension   . Hypothyroidism   . Pulmonary embolism (HCC)   . Thyroid disease    [facility]reesia 03/02/2020   Past Surgical History:  Procedure Laterality Date  . ANKLE SURGERY Right   . Heart ablation    . KNEE ARTHROSCOPY WITH MENISCAL REPAIR    . THUMB ARTHROSCOPY Left    pins   Current Outpatient Medications on File Prior to Visit  Medication Sig Dispense Refill  . levocetirizine (XYZAL) 5 MG tablet TAKE 1 TABLET BY MOUTH EVERY DAY 90 tablet 3  . levothyroxine (SYNTHROID) 125 MCG tablet Take 1 tablet (125 mcg total) by mouth daily. 90 tablet 0  . lisinopril (ZESTRIL) 20 MG tablet TAKE 1 TABLET BY MOUTH EVERY DAY 90 tablet 2  . rivaroxaban (XARELTO) 20 MG TABS tablet TAKE ONE TABLET BY MOUTH ONCE DAILY WITH SUPPER 90 tablet 4   No current facility-administered medications on file prior to visit.   Allergies  Allergen Reactions  . Codeine Nausea And Vomiting  . Penicillins Other (See Comments)    Childhood allergy   Social History   Socioeconomic History  . Marital status: Married    Spouse name: Not on file  . Number of children: 5  . Years of education: Not on file  . Highest education level: Not on file  Occupational History  . Occupation: chief Radiographer, therapeutic  Tobacco Use  . Smoking status: Never Smoker  . Smokeless tobacco: Never Used  Vaping Use  . Vaping Use: Never used  Substance and Sexual Activity  . Alcohol use: No  . Drug use: No  . Sexual activity: Yes  Other Topics Concern  . Not on file  Social History Narrative  . Not on file   Social Determinants of Health   Financial Resource Strain: Not on file  Food Insecurity: Not on file  Transportation Needs: Not on file  Physical Activity: Not on file  Stress: Not on file  Social Connections: Not on file  Intimate Partner Violence: Not on file     Family History  Problem Relation Age of Onset  . Stroke Mother   . Hypothyroidism Mother   . Heart failure Father   . Atrial fibrillation Father    . Prostate cancer Paternal Grandfather   . Bladder Cancer Paternal Grandfather   . Kidney disease Maternal Grandfather     Review of Systems  All other systems reviewed and are negative.      Objective:   Physical Exam Vitals reviewed.  Constitutional:      General: He is not in acute distress.    Appearance: He is well-developed. He is not diaphoretic.  HENT:     Head: Normocephalic and atraumatic.     Right Ear: External ear normal.     Left Ear: External ear normal.     Nose: Nose normal.     Mouth/Throat:     Pharynx: No oropharyngeal exudate.  Eyes:     General: No scleral icterus.       Right eye: No discharge.        Left eye: No discharge.     Conjunctiva/sclera: Conjunctivae normal.     Pupils: Pupils are equal, round, and reactive to light.  Neck:     Thyroid: No thyromegaly.     Vascular: No JVD.     Trachea: No tracheal deviation.  Cardiovascular:     Rate and Rhythm: Normal rate and regular rhythm.     Heart sounds: Normal heart sounds. No murmur heard. No friction rub. No gallop.   Pulmonary:     Effort: Pulmonary effort is normal. No respiratory distress.     Breath sounds: Normal breath sounds. No stridor. No wheezing or rales.  Chest:     Chest wall: No tenderness.  Abdominal:     General: Bowel sounds are normal. There is no distension.     Palpations: Abdomen is soft. There is no mass.     Tenderness: There is no abdominal tenderness. There is no rebound.  Musculoskeletal:        General: No tenderness or deformity. Normal range of motion.     Cervical back: Normal range of motion and neck supple.  Lymphadenopathy:     Cervical: No cervical adenopathy.  Skin:    General: Skin is warm.     Coloration: Skin is not pale.     Findings: No erythema or rash.  Neurological:     Mental Status: He is alert and oriented to person, place, and time.     Cranial Nerves: No cranial nerve deficit.     Motor: No abnormal muscle tone.     Coordination:  Coordination normal.     Deep Tendon Reflexes: Reflexes are normal and symmetric.  Psychiatric:        Behavior: Behavior normal.        Thought Content: Thought  content normal.        Judgment: Judgment normal.           Assessment & Plan:  Essential hypertension - Plan: CBC with Differential/Platelet, COMPLETE METABOLIC PANEL WITH GFR, Lipid panel, TSH  Hypothyroidism, unspecified type - Plan: CBC with Differential/Platelet, COMPLETE METABOLIC PANEL WITH GFR, Lipid panel, TSH  History of recurrent deep vein thrombosis (DVT) - Plan: CBC with Differential/Platelet, COMPLETE METABOLIC PANEL WITH GFR, Lipid panel, TSH  Colon cancer screening - Plan: Ambulatory referral to Gastroenterology  Chronic pain of left knee - Plan: DG Knee Complete 4 Views Left  Patient's blood pressure is excellent.  Due to his chronic use of Xarelto, I would like to check a CBC to monitor for any anemia.  Due to his use of levothyroxine I would like to check a TSH to monitor the dose to ensure it is adequate.  I recommended monitoring a TSH every 6 months due to his age being greater than 7.  I will also check a CMP and a lipid panel.  I will schedule the patient to meet with gastroenterology regarding the colonoscopy.  His PSA was checked in October and was normal.  I suspect that he has osteoarthritis in his left knee.  Begin by obtaining an x-ray of the left knee at his earliest convenience.  He can certainly use Tylenol and Voltaren gel for pain relief.  If the x-ray shows severe arthritis, his decision will be between symptom management with cortisone injections versus a referral to orthopedist to discuss surgical replacement.

## 2020-10-31 LAB — LIPID PANEL
Cholesterol: 179 mg/dL (ref <200)
HDL: 44 mg/dL (ref >=40)
LDL Cholesterol (Calc): 119 mg/dL (calc) — ABNORMAL HIGH
Non-HDL Cholesterol (Calc): 135 mg/dL (calc) — ABNORMAL HIGH (ref <130)
Total CHOL/HDL Ratio: 4.1 (calc) (ref <5.0)
Triglycerides: 70 mg/dL (ref <150)

## 2020-10-31 LAB — CBC WITH DIFFERENTIAL/PLATELET
Absolute Monocytes: 643 cells/uL (ref 200–950)
Basophils Absolute: 113 cells/uL (ref 0–200)
Basophils Relative: 2.1 %
Eosinophils Absolute: 410 cells/uL (ref 15–500)
Eosinophils Relative: 7.6 %
HCT: 41.6 % (ref 38.5–50.0)
Hemoglobin: 14.1 g/dL (ref 13.2–17.1)
Lymphs Abs: 1534 cells/uL (ref 850–3900)
MCH: 31.5 pg (ref 27.0–33.0)
MCHC: 33.9 g/dL (ref 32.0–36.0)
MCV: 92.9 fL (ref 80.0–100.0)
MPV: 10.4 fL (ref 7.5–12.5)
Monocytes Relative: 11.9 %
Neutro Abs: 2700 cells/uL (ref 1500–7800)
Neutrophils Relative %: 50 %
Platelets: 279 10*3/uL (ref 140–400)
RBC: 4.48 10*6/uL (ref 4.20–5.80)
RDW: 12.1 % (ref 11.0–15.0)
Total Lymphocyte: 28.4 %
WBC: 5.4 10*3/uL (ref 3.8–10.8)

## 2020-10-31 LAB — COMPLETE METABOLIC PANEL WITH GFR
AG Ratio: 1.5 (calc) (ref 1.0–2.5)
ALT: 16 U/L (ref 9–46)
AST: 17 U/L (ref 10–35)
Albumin: 3.9 g/dL (ref 3.6–5.1)
Alkaline phosphatase (APISO): 57 U/L (ref 35–144)
BUN: 25 mg/dL (ref 7–25)
CO2: 24 mmol/L (ref 20–32)
Calcium: 9.2 mg/dL (ref 8.6–10.3)
Chloride: 108 mmol/L (ref 98–110)
Creat: 1.13 mg/dL (ref 0.70–1.25)
GFR, Est African American: 79 mL/min/{1.73_m2} (ref >=60)
GFR, Est Non African American: 68 mL/min/{1.73_m2} (ref >=60)
Globulin: 2.6 g/dL (calc) (ref 1.9–3.7)
Glucose, Bld: 88 mg/dL (ref 65–99)
Potassium: 4.8 mmol/L (ref 3.5–5.3)
Sodium: 139 mmol/L (ref 135–146)
Total Bilirubin: 0.5 mg/dL (ref 0.2–1.2)
Total Protein: 6.5 g/dL (ref 6.1–8.1)

## 2020-10-31 LAB — TSH: TSH: 4.19 mIU/L (ref 0.40–4.50)

## 2020-12-28 ENCOUNTER — Other Ambulatory Visit: Payer: Self-pay | Admitting: Family Medicine

## 2021-01-22 ENCOUNTER — Telehealth (INDEPENDENT_AMBULATORY_CARE_PROVIDER_SITE_OTHER): Payer: BC Managed Care – PPO | Admitting: Nurse Practitioner

## 2021-01-22 ENCOUNTER — Telehealth: Payer: Self-pay | Admitting: Family Medicine

## 2021-01-22 DIAGNOSIS — U071 COVID-19: Secondary | ICD-10-CM

## 2021-01-22 DIAGNOSIS — J069 Acute upper respiratory infection, unspecified: Secondary | ICD-10-CM | POA: Diagnosis not present

## 2021-01-22 NOTE — Progress Notes (Signed)
Subjective:    Patient ID: Edgar Miller, male    DOB: [birth year], 65 y.o.   MRN: [id number]  HPI: Edgar Miller is a 65 y.o. male presenting virtually for cough and congestion.  Chief Complaint  Patient presents with   Sore Throat   UPPER RESPIRATORY TRACT INFECTION Onset: Wednesday COVID-19 testing history: no COVID-19 vaccination status: Pzifer and 1 booster Fever: low grade Cough:  yes; congested and dry Shortness of breath: no Wheezing: no Chest pain: no Chest tightness: no Chest congestion: no Nasal congestion: yes Runny nose: yes Post nasal drip: yes Sneezing: no Sore throat: yes Swollen glands: no Sinus pressure: yes; around nose and cheeks Headache: no Face pain: no Toothache: no Ear pain: no  Ear pressure: yes  Eyes red/itching:no Eye drainage/crusting:  watery eyes   Nausea: no  Vomiting: no Diarrhea: no  Change in appetite: yes; decreased  Loss of taste/smell: no  Rash: no Fatigue: yes Sick contacts: no Strep contacts: no  Context: stable Recurrent sinusitis: no Treatments attempted: Mucinex, Tylenol Relief with OTC medications: no  Allergies  Allergen Reactions   Codeine Nausea And Vomiting   Penicillins Other (See Comments)    Childhood allergy    Outpatient Encounter Medications as of 01/22/2021  Medication Sig   levocetirizine (XYZAL) 5 MG tablet TAKE 1 TABLET BY MOUTH EVERY DAY   levothyroxine (SYNTHROID) 125 MCG tablet TAKE 1 TABLET BY MOUTH EVERY DAY   lisinopril (ZESTRIL) 20 MG tablet TAKE 1 TABLET BY MOUTH EVERY DAY   rivaroxaban (XARELTO) 20 MG TABS tablet TAKE ONE TABLET BY MOUTH ONCE DAILY WITH SUPPER   No facility-administered encounter medications on file as of 01/22/2021.    Patient Active Problem List   Diagnosis Date Noted   Annual physical exam 11/27/2014   Primary osteoarthritis of left knee 11/27/2014   Hematuria 11/27/2014   Eustachian tube dysfunction 11/27/2014   DVT (deep venous thrombosis) (HCC) 08/15/2014    Essential hypertension 08/15/2014   Hypothyroidism 08/15/2014   Pulmonary emboli (HCC) 08/14/2014    Past Medical History:  Diagnosis Date   Allergic rhinitis    Allergy    Phreesia 03/02/2020   Clotting disorder (HCC)    Phreesia 03/02/2020   Hypertension    Hypothyroidism    Pulmonary embolism (HCC)    Thyroid disease    Phreesia 03/02/2020    Relevant past medical, surgical, family and social history reviewed and updated as indicated. Interim medical history since our last visit reviewed.  Review of Systems Per HPI unless specifically indicated above     Objective:    There were no vitals taken for this visit.  Wt Readings from Last 3 Encounters:  10/30/20 188 lb (85.3 kg)  10/09/20 190 lb 9.6 oz (86.5 kg)  03/03/20 193 lb (87.5 kg)    Physical Exam Vitals and nursing note reviewed.  Constitutional:      General: He is not in acute distress.    Appearance: He is not ill-appearing or toxic-appearing.  HENT:     Head: Normocephalic and atraumatic.     Nose: Congestion present. No rhinorrhea.     Mouth/Throat:     Mouth: Mucous membranes are moist.     Pharynx: Oropharynx is clear.  Eyes:     General: No scleral icterus.       Right eye: No discharge.        Left eye: No discharge.  Cardiovascular:     Comments: Unable to assess heart sounds via virtual  visit. Pulmonary:     Effort: Pulmonary effort is normal. No respiratory distress.     Comments: Unable to assess breath sounds via virtual visit.  Patient talking in complete sentences during telemedicine visit without accessory muscle use. Skin:    Coloration: Skin is not jaundiced or pale.     Findings: No erythema.  Neurological:     General: No focal deficit present.     Mental Status: He is alert and oriented to person, place, and time.     Motor: No weakness.  Psychiatric:        Mood and Affect: Mood normal.        Behavior: Behavior normal.        Thought Content: Thought content normal.         Judgment: Judgment normal.      Assessment & Plan:  1. Upper respiratory tract infection, unspecified type Acute.  This is day 2 of symptoms.  Encourage patient to obtain COVID testing. Reassured patient that symptoms and exam findings are most consistent with a viral upper respiratory infection and explained lack of efficacy of antibiotics against viruses.  Discussed expected course and features suggestive of secondary bacterial infection.  Continue supportive care. Increase fluid intake with water or electrolyte solution like pedialyte. Encouraged acetaminophen as needed for fever/pain. Encouraged salt water gargling, chloraseptic spray and throat lozenges. Encouraged OTC guaifenesin. Encouraged saline sinus flushes and/or neti with humidified air.  If COVID test positive, would not be a candidate for Paxlovid secondary to Xarelto use.  However, he would be a candidate for molnupiravir.  Will need to start on treatment by Monday in order to be within the prescribing timeframe.  With any sudden onset new chest pain, dizziness, sweating, or shortness of breath, go to ED.    - SARS-COV-2 RNA,(COVID-19) QUAL NAAT     Follow up plan: No follow-ups on file.  Due to the catastrophic nature of the COVID-19 pandemic, this video visit was completed soley via audio and visual contact via Caregility due to the restrictions of the COVID-19 pandemic.  All issues as above were discussed and addressed. Physical exam was done as above through visual confirmation on Caregility. If it was felt that the patient should be evaluated in the office, they were directed there. The patient verbally consented to this visit. Location of the patient: home Location of the provider: work Those involved with this call:  Provider: Cathlean Marseilles, DNP, FNP-C CMA: Harrold Donath, LPN Front Desk/Registration: n/a  Time spent on call:  8 minutes with patient face to face via video conference. More than 50% of this time was  spent in counseling and coordination of care. 10 minutes total spent in review of patient's record and preparation of their chart. I verified patient identity using two factors (patient name and date of birth). Patient consents verbally to being seen via telemedicine visit today.

## 2021-01-22 NOTE — Telephone Encounter (Signed)
Call placed to patient.  Advised that he will need OV to be assessed.   Appointment scheduled for virtual visit.

## 2021-01-22 NOTE — Telephone Encounter (Signed)
Patient called to request mediation for symptom management; no recent covid test. Symptoms: congestion, cough, sore throat diminished sense of taste since Tuesday 8/23. Symptoms worsening. OTC meds not helping. Please advise at [phone].

## 2021-01-23 LAB — SARS-COV-2 RNA,(COVID-19) QUALITATIVE NAAT: SARS CoV2 RNA: DETECTED — AB

## 2021-01-24 MED ORDER — MOLNUPIRAVIR EUA 200MG CAPSULE: 4.0000 | ORAL_CAPSULE | Freq: Two times a day (BID) | ORAL | 0 refills | Status: AC

## 2021-01-24 NOTE — Addendum Note (Signed)
Addended by: Cathlean Marseilles A on: 01/24/2021 05:45 AM   Modules accepted: Orders

## 2021-03-26 ENCOUNTER — Other Ambulatory Visit: Payer: Self-pay | Admitting: Family Medicine

## 2021-05-04 ENCOUNTER — Other Ambulatory Visit: Payer: Self-pay

## 2021-05-04 MED ORDER — LISINOPRIL 20 MG PO TABS: 20.0000 mg | ORAL_TABLET | Freq: Every day | ORAL | 2 refills | Status: DC

## 2021-06-28 ENCOUNTER — Other Ambulatory Visit: Payer: Self-pay | Admitting: Family Medicine

## 2021-10-21 ENCOUNTER — Encounter: Payer: Self-pay | Admitting: Family Medicine

## 2021-10-21 ENCOUNTER — Ambulatory Visit: Payer: BC Managed Care – PPO | Admitting: Family Medicine

## 2021-10-21 VITALS — BP 118/76 | HR 63 | Ht 68.0 in | Wt 181.6 lb

## 2021-10-21 DIAGNOSIS — E039 Hypothyroidism, unspecified: Secondary | ICD-10-CM | POA: Diagnosis not present

## 2021-10-21 MED ORDER — HYDROCORTISONE 2.5 % EX CREA: TOPICAL_CREAM | Freq: Two times a day (BID) | CUTANEOUS | 1 refills | Status: DC

## 2021-10-21 NOTE — Progress Notes (Signed)
Subjective:    Patient ID: Edgar Miller, male    DOB: [birth year], 66 y.o.   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  He is overdue for lab work.  However he primarily made this appointment today because he has a history of recurrent painful external hemorrhoids.  He wants to know what he can do to try to help prevent them.  Past Medical History:  Diagnosis Date   Allergic rhinitis    Allergy    Phreesia 03/02/2020   Clotting disorder (HCC)    Phreesia 03/02/2020   Hypertension    Hypothyroidism    Pulmonary embolism (HCC)    Thyroid disease    Phreesia 03/02/2020   Past Surgical History:  Procedure Laterality Date   ANKLE SURGERY Right    Heart ablation     KNEE ARTHROSCOPY WITH MENISCAL REPAIR     THUMB ARTHROSCOPY Left    pins   Current Outpatient Medications on File Prior to Visit  Medication Sig Dispense Refill   levocetirizine (XYZAL) 5 MG tablet TAKE 1 TABLET BY MOUTH EVERY DAY 90 tablet 3   levothyroxine (SYNTHROID) 125 MCG tablet TAKE 1 TABLET BY MOUTH EVERY DAY 90 tablet 1   lisinopril (ZESTRIL) 20 MG tablet Take 1 tablet (20 mg total) by mouth daily. 90 tablet 2   rivaroxaban (XARELTO) 20 MG TABS tablet TAKE ONE TABLET BY MOUTH ONCE DAILY WITH SUPPER 90 tablet 4   No current facility-administered medications on file prior to visit.   Allergies  Allergen Reactions   Codeine Nausea And Vomiting   Penicillins Other (See Comments)    Childhood allergy   Social History   Socioeconomic History   Marital status: Married    Spouse name: Not on file   Number of children: 5   Years of education: Not on file   Highest education level: Not on file  Occupational History   Occupation: chief Radiographer, therapeutic  Tobacco Use   Smoking status: Never   Smokeless tobacco: Never  Vaping Use   Vaping Use: Never used   Substance and Sexual Activity   Alcohol use: No   Drug use: No   Sexual activity: Yes  Other Topics Concern   Not on file  Social History Narrative   Not on file   Social Determinants of Health   Financial Resource Strain: Not on file  Food Insecurity: Not on file  Transportation Needs: Not on file  Physical Activity: Not on file  Stress: Not on file  Social Connections: Not on file  Intimate Partner Violence: Not on file     Family History  Problem Relation Age of Onset   Stroke Mother    Hypothyroidism Mother    Heart failure Father    Atrial fibrillation Father    Prostate cancer Paternal Grandfather    Bladder Cancer Paternal Grandfather    Kidney disease Maternal Grandfather     Review of Systems  All other systems reviewed and are negative.     Objective:   Physical Exam Vitals reviewed.  Constitutional:      General: He is not in acute distress.    Appearance: He is well-developed. He is not diaphoretic.  HENT:     Head: Normocephalic and atraumatic.     Right Ear: External ear normal.     Left Ear: External ear normal.  Nose: Nose normal.     Mouth/Throat:     Pharynx: No oropharyngeal exudate.  Eyes:     General: No scleral icterus.       Right eye: No discharge.        Left eye: No discharge.     Conjunctiva/sclera: Conjunctivae normal.     Pupils: Pupils are equal, round, and reactive to light.  Neck:     Thyroid: No thyromegaly.     Vascular: No JVD.     Trachea: No tracheal deviation.  Cardiovascular:     Rate and Rhythm: Normal rate and regular rhythm.     Heart sounds: Normal heart sounds. No murmur heard.   No friction rub. No gallop.  Pulmonary:     Effort: Pulmonary effort is normal. No respiratory distress.     Breath sounds: Normal breath sounds. No stridor. No wheezing or rales.  Chest:     Chest wall: No tenderness.  Abdominal:     General: Bowel sounds are normal. There is no distension.     Palpations: Abdomen is soft.  There is no mass.     Tenderness: There is no abdominal tenderness. There is no rebound.  Musculoskeletal:        General: No tenderness or deformity. Normal range of motion.     Cervical back: Normal range of motion and neck supple.  Lymphadenopathy:     Cervical: No cervical adenopathy.  Skin:    General: Skin is warm.     Coloration: Skin is not pale.     Findings: No erythema or rash.  Neurological:     Mental Status: He is alert and oriented to person, place, and time.     Cranial Nerves: No cranial nerve deficit.     Motor: No abnormal muscle tone.     Coordination: Coordination normal.     Deep Tendon Reflexes: Reflexes are normal and symmetric.  Psychiatric:        Behavior: Behavior normal.        Thought Content: Thought content normal.        Judgment: Judgment normal.          Assessment & Plan:  Hypothyroidism, unspecified type - Plan: CBC with Differential/Platelet, COMPLETE METABOLIC PANEL WITH GFR, TSH All the patient is here today I will check a CBC a CMP and a TSH to monitor the management of his hypothyroidism.  His blood pressure today is excellent.  He can return fasting to check his cholesterol.  We discussed the natural history of hemorrhoids.  I explained that they are blood vessels.  Therefore anything he can do to offset the pressure such as sitting on a thick cushion while driving we will taking frequent breaks to get out of the car and walk around would be beneficial.  I gave him prescription for 2.5% hydrocortisone cream to apply 2-3 times daily when they become inflamed.  He can also use topical lidocaine if  they become painful.

## 2021-10-22 LAB — CBC WITH DIFFERENTIAL/PLATELET
Absolute Monocytes: 551 cells/uL (ref 200–950)
Basophils Absolute: 101 cells/uL (ref 0–200)
Basophils Relative: 1.9 %
Eosinophils Absolute: 249 cells/uL (ref 15–500)
Eosinophils Relative: 4.7 %
HCT: 41.4 % (ref 38.5–50.0)
Hemoglobin: 13.9 g/dL (ref 13.2–17.1)
Lymphs Abs: 1346 cells/uL (ref 850–3900)
MCH: 31.7 pg (ref 27.0–33.0)
MCHC: 33.6 g/dL (ref 32.0–36.0)
MCV: 94.3 fL (ref 80.0–100.0)
MPV: 10.7 fL (ref 7.5–12.5)
Monocytes Relative: 10.4 %
Neutro Abs: 3053 cells/uL (ref 1500–7800)
Neutrophils Relative %: 57.6 %
Platelets: 252 10*3/uL (ref 140–400)
RBC: 4.39 10*6/uL (ref 4.20–5.80)
RDW: 11.9 % (ref 11.0–15.0)
Total Lymphocyte: 25.4 %
WBC: 5.3 10*3/uL (ref 3.8–10.8)

## 2021-10-22 LAB — COMPLETE METABOLIC PANEL WITH GFR
AG Ratio: 1.7 (calc) (ref 1.0–2.5)
ALT: 17 U/L (ref 9–46)
AST: 15 U/L (ref 10–35)
Albumin: 3.9 g/dL (ref 3.6–5.1)
Alkaline phosphatase (APISO): 58 U/L (ref 35–144)
BUN/Creatinine Ratio: 22 (calc) (ref 6–22)
BUN: 26 mg/dL — ABNORMAL HIGH (ref 7–25)
CO2: 22 mmol/L (ref 20–32)
Calcium: 9.5 mg/dL (ref 8.6–10.3)
Chloride: 109 mmol/L (ref 98–110)
Creat: 1.18 mg/dL (ref 0.70–1.35)
Globulin: 2.3 g/dL (calc) (ref 1.9–3.7)
Glucose, Bld: 95 mg/dL (ref 65–99)
Potassium: 5 mmol/L (ref 3.5–5.3)
Sodium: 140 mmol/L (ref 135–146)
Total Bilirubin: 0.4 mg/dL (ref 0.2–1.2)
Total Protein: 6.2 g/dL (ref 6.1–8.1)
eGFR: 68 mL/min/{1.73_m2} (ref >=60)

## 2021-10-22 LAB — TSH: TSH: 0.54 mIU/L (ref 0.40–4.50)

## 2021-11-10 ENCOUNTER — Other Ambulatory Visit: Payer: Self-pay | Admitting: Family Medicine

## 2021-11-10 ENCOUNTER — Other Ambulatory Visit: Payer: Self-pay | Admitting: Adult Health

## 2021-11-10 NOTE — Telephone Encounter (Signed)
Requested Prescriptions  Pending Prescriptions Disp Refills  . levothyroxine (SYNTHROID) 125 MCG tablet [Pharmacy Med Name: LEVOTHYROXINE 125 MCG TABLET] 90 tablet 1    Sig: TAKE 1 TABLET BY MOUTH EVERY DAY     Endocrinology:  Hypothyroid Agents Passed - 11/10/2021 12:05 PM      Passed - TSH in normal range and within 360 days    TSH  Date Value Ref Range Status  10/21/2021 0.54 0.40 - 4.50 mIU/L Final         Passed - Valid encounter within last 12 months    Recent Outpatient Visits          2 weeks ago Hypothyroidism, unspecified type   [place] Susy Frizzle, MD   9 months ago Upper respiratory tract infection, unspecified type   [place] Eulogio Bear, NP   1 year ago Essential hypertension   [place] Pickard, Cammie Mcgee, MD   1 year ago Annual physical exam   [place] Susy Frizzle, MD   2 years ago Ingrown toenail of both [place] Pickard, Cammie Mcgee, MD

## 2021-11-15 ENCOUNTER — Telehealth: Payer: Self-pay | Admitting: Adult Health

## 2021-11-15 MED ORDER — RIVAROXABAN 20 MG PO TABS: ORAL_TABLET | ORAL | 4 refills | Status: DC

## 2021-11-15 NOTE — Telephone Encounter (Signed)
Rx for pt's Xarelto has been sent to preferred pharmacy for pt. Attempted to call pt to let himknow this had been done but unable to reach. Left pt a detailed message letting him know this was done. Nothing further needed.

## 2021-11-18 ENCOUNTER — Ambulatory Visit: Payer: BC Managed Care – PPO | Admitting: Adult Health

## 2021-11-18 ENCOUNTER — Encounter: Payer: Self-pay | Admitting: Adult Health

## 2021-11-18 DIAGNOSIS — I2699 Other pulmonary embolism without acute cor pulmonale: Secondary | ICD-10-CM | POA: Diagnosis not present

## 2021-11-18 NOTE — Assessment & Plan Note (Signed)
Recurrent PE on lifelong anticoagulation appears to be doing well.  Endorses compliance.  Appears to be tolerating well.  Recent CBC showed no signs of anemia.  Patient education on anticoagulation given  Plan  Patient Instructions  Avoid NSAIDS (advil, ibuprofen, aleve,naproxen , excedrin, etc)  These can cause bleeding.  Continue on Xarelto 20mg  daily  Follow up Dr. or Emery Dupuy NP   In  1 year and as needed   Please contact office for sooner follow up if symptoms do not improve or worsen or seek emergency care

## 2021-11-18 NOTE — Patient Instructions (Signed)
Avoid NSAIDS (advil, ibuprofen, aleve,naproxen , excedrin, etc)  These can cause bleeding.  Continue on Xarelto 20mg  daily  Follow up Dr. or Olamae Ferrara NP   In  1 year and as needed   Please contact office for sooner follow up if symptoms do not improve or worsen or seek emergency care

## 2021-11-18 NOTE — Progress Notes (Signed)
@Patient  ID: , male    DOB: [birth year], 66 y.o.   MRN: [id number]  Chief Complaint  Patient presents with   Follow-up    Referring provider: [id number], MD  HPI: 66 year old male followed for recurrent PE.  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 This was second PE . [place]ensive travel to [place]   CT angiogram of the chest showed pulmonary embolism involving bilateral pulmonary arteries.  Venous Doppler showed a positive right lower extremity DVT-occlusive DVT with the proximal posterior tibial vein. 2-D echo showed an EF of 55-60%, right ventricle moderately dilated., PAP 49  CT chest 02/2015 neg PE , Lung clear  Echo 02/2015 EF nml , PAP nml , mild TV regurg  ANA and prothrombin gene mutation was negative. Sedimentation rate was 18.    11/18/2021 Follow up : Recurrent PE Patient returns for 1 year follow-up.  Patient has a history of recurrent PE and DVT.  He had a second PE and DVT in 2016.  He is on lifelong anticoagulation therapy with Xarelto.  Previous echo showed right ventricle moderate dilation.  With elevated pulmonary artery pressure.  A repeat echo in 2016 showed normal pulmonary artery pressure and RV size.  Patient says he is doing well since last visit.  He is very active.  Works full-time.  He owns his own business.  He plays golf and tennis. Recent labs showed normal CBC.  He denies any chest pain, hemoptysis, orthopnea.  No leg swelling.   Allergies  Allergen Reactions   Codeine Nausea And Vomiting   Penicillins Other (See Comments)     Childhood allergy    Immunization History  Administered Date(s) Administered   Influenza Split 03/30/2014, 02/28/2016   Influenza,inj,Quad PF,6+ Mos 04/28/2017, 03/03/2020   Moderna Sars-Covid-2 Vaccination 09/02/2019, 09/30/2019, 06/15/2020   Pneumococcal Polysaccharide-23 10/30/2020   Tdap 08/02/2016   Zoster, Live 08/02/2016    Past Medical History:  Diagnosis Date   Allergic rhinitis    Allergy    Phreesia 03/02/2020   Clotting disorder (HCC)    Phreesia 03/02/2020   Hypertension    Hypothyroidism    Pulmonary embolism (HCC)    Thyroid disease    Phreesia 03/02/2020    Tobacco History: Social History   Tobacco Use  Smoking Status Never  Smokeless Tobacco Never   Counseling given: Not Answered   Outpatient Medications Prior to Visit  Medication Sig Dispense Refill   levocetirizine (XYZAL) 5 MG tablet TAKE 1 TABLET BY MOUTH EVERY DAY 90 tablet 3   levothyroxine (SYNTHROID) 125 MCG tablet TAKE 1 TABLET BY MOUTH EVERY DAY 90 tablet 0   lisinopril (ZESTRIL) 20 MG tablet Take 1 tablet (20 mg total) by mouth daily. 90 tablet 2   rivaroxaban (XARELTO) 20 MG TABS tablet TAKE ONE TABLET BY MOUTH ONCE DAILY WITH SUPPER 90 tablet 4   hydrocortisone 2.5 % cream Apply topically 2 (two) times daily. 30 g 1   No facility-administered medications prior to visit.     Review of Systems:   Constitutional:   No  weight loss, night sweats,  Fevers, chills, fatigue,  or  lassitude.  HEENT:   No headaches,  Difficulty swallowing,  Tooth/dental problems, or  Sore throat,                No sneezing, itching, ear ache, nasal congestion, post nasal drip,   CV:  No chest pain,  Orthopnea, PND, swelling in lower extremities, anasarca, dizziness, palpitations, syncope.   GI  No heartburn, indigestion, abdominal pain, nausea, vomiting, diarrhea, change in bowel habits, loss of appetite, bloody stools.   Resp: No shortness of breath with exertion or at rest.  No excess mucus, no  productive cough,  No non-productive cough,  No coughing up of blood.  No change in color of mucus.  No wheezing.  No chest wall deformity  Skin: no rash or lesions.  GU: no dysuria, change in color of urine, no urgency or frequency.  No flank pain, no hematuria   MS:  No joint pain or swelling.  No decreased range of motion.  No back pain.    Physical Exam  BP 130/80 (BP Location: Left Arm, Patient Position: Sitting, Cuff Size: Normal)   Pulse 65   Temp 97.6 F (36.4 C) (Oral)   Ht 5\' 8"  (1.727 m)   Wt 181 lb 3.2 oz (82.2 kg)   SpO2 99%   BMI 27.55 kg/m   GEN: A/Ox3; pleasant , NAD, well nourished    HEENT:  [place]/AT,  NOSE-clear, THROAT-clear, no lesions, no postnasal drip or exudate noted.   NECK:  Supple w/ fair ROM; no JVD; normal carotid impulses w/o bruits; no thyromegaly or nodules palpated; no lymphadenopathy.    RESP  Clear  P & A; w/o, wheezes/ rales/ or rhonchi. no accessory muscle use, no dullness to percussion  CARD:  RRR, no m/r/g, no peripheral edema, pulses intact, no cyanosis or clubbing.  GI:   Soft & nt; nml bowel sounds; no organomegaly or masses detected.   Musco: Warm bil, no deformities or joint swelling noted.   Neuro: alert, no focal deficits noted.    Skin: Warm, no lesions or rashes    Lab Results:  CBC    Component Value Date/Time   WBC 5.3 10/21/2021 0947   RBC 4.39 10/21/2021 0947   HGB 13.9 10/21/2021 0947   HCT 41.4 10/21/2021 0947   PLT 252 10/21/2021 0947   MCV 94.3 10/21/2021 0947   MCH 31.7 10/21/2021 0947   MCHC 33.6 10/21/2021 0947   RDW 11.9 10/21/2021 0947   LYMPHSABS 1,346 10/21/2021 0947   MONOABS 640 08/02/2016 0910   EOSABS 249 10/21/2021 0947   BASOSABS 101 10/21/2021 0947    BMET    Component Value Date/Time   NA 140 10/21/2021 0947   K 5.0 10/21/2021 0947   CL 109 10/21/2021 0947   CO2 22 10/21/2021 0947   GLUCOSE 95 10/21/2021 0947   BUN 26 (H) 10/21/2021 0947   CREATININE 1.18 10/21/2021 0947    CALCIUM 9.5 10/21/2021 0947   GFRNONAA 68 10/30/2020 0813   GFRAA 79 10/30/2020 0813    BNP    Component Value Date/Time   BNP 125.2 (H) 08/15/2014 0643    ProBNP No results found for: "PROBNP"  Imaging: No results found.        No data to display          No results found for: "NITRICOXIDE"      Assessment & Plan:   Pulmonary emboli (HCC) Recurrent PE on lifelong anticoagulation appears to be doing well.  Endorses  compliance.  Appears to be tolerating well.  Recent CBC showed no signs of anemia.  Patient education on anticoagulation given  Plan  Patient Instructions  Avoid NSAIDS (advil, ibuprofen, aleve,naproxen , excedrin, etc)  These can cause bleeding.  Continue on Xarelto 20mg  daily  Follow up Dr. or Paislynn Hegstrom NP   In  1 year and as needed   Please contact office for sooner follow up if symptoms do not improve or worsen or seek emergency care        Delton Coombes, NP 11/18/2021

## 2021-12-15 ENCOUNTER — Ambulatory Visit: Payer: Medicare HMO | Admitting: Podiatry

## 2021-12-15 ENCOUNTER — Ambulatory Visit (INDEPENDENT_AMBULATORY_CARE_PROVIDER_SITE_OTHER): Payer: Medicare HMO

## 2021-12-15 DIAGNOSIS — M7751 Other enthesopathy of right foot: Secondary | ICD-10-CM

## 2021-12-15 MED ORDER — METHYLPREDNISOLONE 4 MG PO TBPK: ORAL_TABLET | ORAL | 0 refills | Status: DC

## 2021-12-15 MED ORDER — BETAMETHASONE SOD PHOS & ACET 6 (3-3) MG/ML IJ SUSP
3.0000 mg | Freq: Once | INTRAMUSCULAR | Status: AC
  Administered 2021-12-15: 3 mg via INTRA_ARTICULAR

## 2021-12-15 NOTE — Progress Notes (Signed)
   Chief Complaint  Patient presents with   Ankle Pain    Patient presents with right ankle pain, he states that he has had for 3 weeks off and on, describes the pain as a sharp pain especially when he is barefoot, he has rubbed his ankle at night, the patient is not big on taking pain medication.    Subjective:  66 y.o. male presenting today as a new patient for evaluation of right ankle pain this been going on for a few weeks now since November 30, 2021.  Patient states that he went and played 90 holes of golf and ever since that time he has been experiencing pain and tenderness to the ankle.  Pain with range of motion as well.  He has not done anything currently for treatment.   Past Medical History:  Diagnosis Date   Allergic rhinitis    Allergy    Phreesia 03/02/2020   Clotting disorder (HCC)    Phreesia 03/02/2020   Hypertension    Hypothyroidism    Pulmonary embolism (HCC)    Thyroid disease    Phreesia 03/02/2020   Past Surgical History:  Procedure Laterality Date   ANKLE SURGERY Right    Heart ablation     KNEE ARTHROSCOPY WITH MENISCAL REPAIR     THUMB ARTHROSCOPY Left    pins   Allergies  Allergen Reactions   Codeine Nausea And Vomiting   Penicillins Other (See Comments)    Childhood allergy     Objective / Physical Exam:  General:  The patient is alert and oriented x3 in no acute distress. Dermatology:  Skin is warm, dry and supple bilateral lower extremities. Negative for open lesions or macerations. Vascular:  Palpable pedal pulses bilaterally. No edema or erythema noted. Capillary refill within normal limits. Neurological:  Epicritic and protective threshold grossly intact bilaterally.  Musculoskeletal Exam:  Pain on palpation to the lateral aspect of the patient's right ankle. Mild edema noted. Range of motion within normal limits to all pedal and ankle joints bilateral. Muscle strength 5/5 in all groups bilateral.   Radiographic Exam RT ankle 12/15/2021:   Normal osseous mineralization.  Degenerative changes with joint space narrowing of the tibiotalar joint lateral aspect noted consistent with chronic advanced osteoarthritis  Assessment: 1.  Capsulitis/DJD RT ankle  Plan of Care:  1. Patient was evaluated. X-Rays reviewed.  2. Injection of 0.5 mL Celestone Soluspan injected in the patient's right ankle. 3.  Prescription for Medrol Dosepak 4.  Patient is on anticoagulant Xarelto.  No NSAIDs 5.  Recommend ankle brace during activity 6.  Advised against going barefoot 7.  Return to clinic as needed   Felecia Shelling, DPM [facility]  Dr. Felecia Shelling, DPM    [address]                                        [place], [place] [address]                Office [phone]  Fax [phone]

## 2022-03-04 ENCOUNTER — Ambulatory Visit (INDEPENDENT_AMBULATORY_CARE_PROVIDER_SITE_OTHER): Payer: Medicare HMO | Admitting: Family Medicine

## 2022-03-04 VITALS — BP 126/82 | HR 66 | Temp 98.5°F | Ht 68.0 in | Wt 176.6 lb

## 2022-03-04 DIAGNOSIS — I10 Essential (primary) hypertension: Secondary | ICD-10-CM

## 2022-03-04 DIAGNOSIS — Z1211 Encounter for screening for malignant neoplasm of colon: Secondary | ICD-10-CM

## 2022-03-04 DIAGNOSIS — E039 Hypothyroidism, unspecified: Secondary | ICD-10-CM

## 2022-03-04 DIAGNOSIS — Z125 Encounter for screening for malignant neoplasm of prostate: Secondary | ICD-10-CM | POA: Diagnosis not present

## 2022-03-04 DIAGNOSIS — Z0000 Encounter for general adult medical examination without abnormal findings: Secondary | ICD-10-CM

## 2022-03-04 DIAGNOSIS — Z23 Encounter for immunization: Secondary | ICD-10-CM

## 2022-03-04 MED ORDER — LISINOPRIL 20 MG PO TABS: 20.0000 mg | ORAL_TABLET | Freq: Every day | ORAL | 3 refills | Status: DC

## 2022-03-04 NOTE — Progress Notes (Signed)
Subjective:    Patient ID: Edgar Miller, male    DOB: 01/26/1956, 66 y.o.   MRN: [id number]  HPI Patient is a very pleasant 66 year old white male here today for a check up. Past medical history significant for hypothyroidism, essential hypertension, and also DVT/PE 2. The first time he had a DVT was after prolonged car rides back and forth to [place]. After prolonged immobility, he developed a DVT in his left leg he believes with resultant pulmonary embolism. 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 After that decision he decided to remain on lifelong anticoagulation.  Patient is overdue for colonoscopy.  He elects to do Cologuard.  He is due for prostate cancer screening.  He is due for fasting lab work.  He is due for a flu shot, Prevnar 20, and a COVID booster.  Otherwise he is doing well   Past Medical History:  Diagnosis Date   Allergic rhinitis    Allergy    Phreesia 03/02/2020   Clotting disorder ([facility])    Phreesia 03/02/2020   Hypertension    Hypothyroidism    Pulmonary embolism ([place])    Thyroid disease    Phreesia 03/02/2020   Past Surgical History:  Procedure Laterality Date   ANKLE SURGERY Right    Heart ablation     KNEE ARTHROSCOPY WITH MENISCAL REPAIR     THUMB ARTHROSCOPY Left    pins   Current Outpatient Medications on File Prior to Visit  Medication Sig Dispense Refill   levocetirizine (XYZAL) 5 MG tablet TAKE 1 TABLET BY MOUTH EVERY DAY 90 tablet 3   levothyroxine (SYNTHROID) 125 MCG tablet TAKE 1 TABLET BY MOUTH EVERY DAY 90 tablet 0   lisinopril (ZESTRIL) 20 MG tablet Take 1 tablet (20 mg total) by mouth daily. 90 tablet 2   methylPREDNISolone (MEDROL DOSEPAK) 4 MG TBPK tablet 6 day dose pack - take as directed 21 tablet 0   rivaroxaban (XARELTO) 20 MG TABS tablet TAKE ONE TABLET BY MOUTH ONCE DAILY WITH SUPPER 90 tablet 4   No current facility-administered  medications on file prior to visit.   Allergies  Allergen Reactions   Codeine Nausea And Vomiting   Penicillins Other (See Comments)    Childhood allergy   Social History   Socioeconomic History   Marital status: Married    Spouse name: Not on file   Number of children: 5   Years of education: Not on file   Highest education level: Not on file  Occupational History   Occupation: chief Merchant navy officer  Tobacco Use   Smoking status: Never   Smokeless tobacco: Never  Vaping Use   Vaping Use: Never used  Substance and Sexual Activity   Alcohol use: No   Drug use: No   Sexual activity: Yes  Other Topics Concern   Not on file  Social History Narrative   Not on file   Social Determinants of Health   Financial Resource Strain: Not on file  Food Insecurity: Not on file  Transportation Needs: Not on file  Physical Activity: Not on file  Stress: Not on file  Social Connections: Not on file  Intimate Partner Violence: Not on file     Family History  Problem Relation Age of Onset   Stroke Mother    Hypothyroidism Mother    Heart failure Father    Atrial fibrillation Father    Prostate cancer Paternal Grandfather  Bladder Cancer Paternal Grandfather    Kidney disease Maternal Grandfather     Review of Systems  All other systems reviewed and are negative.      Objective:   Physical Exam Vitals reviewed.  Constitutional:      General: He is not in acute distress.    Appearance: He is well-developed. He is not diaphoretic.  HENT:     Head: Normocephalic and atraumatic.     Right Ear: External ear normal.     Left Ear: External ear normal.     Nose: Nose normal.     Mouth/Throat:     Pharynx: No oropharyngeal exudate.  Eyes:     General: No scleral icterus.       Right eye: No discharge.        Left eye: No discharge.     Conjunctiva/sclera: Conjunctivae normal.     Pupils: Pupils are equal, round, and reactive to light.  Neck:     Thyroid: No  thyromegaly.     Vascular: No JVD.     Trachea: No tracheal deviation.  Cardiovascular:     Rate and Rhythm: Normal rate and regular rhythm.     Heart sounds: Normal heart sounds. No murmur heard.    No friction rub. No gallop.  Pulmonary:     Effort: Pulmonary effort is normal. No respiratory distress.     Breath sounds: Normal breath sounds. No stridor. No wheezing or rales.  Chest:     Chest wall: No tenderness.  Abdominal:     General: Bowel sounds are normal. There is no distension.     Palpations: Abdomen is soft. There is no mass.     Tenderness: There is no abdominal tenderness. There is no rebound.  Musculoskeletal:        General: No tenderness or deformity. Normal range of motion.     Cervical back: Normal range of motion and neck supple.  Lymphadenopathy:     Cervical: No cervical adenopathy.  Skin:    General: Skin is warm.     Coloration: Skin is not pale.     Findings: No erythema or rash.  Neurological:     Mental Status: He is alert and oriented to person, place, and time.     Cranial Nerves: No cranial nerve deficit.     Motor: No abnormal muscle tone.     Coordination: Coordination normal.     Deep Tendon Reflexes: Reflexes are normal and symmetric.  Psychiatric:        Behavior: Behavior normal.        Thought Content: Thought content normal.        Judgment: Judgment normal.      Precancerous lesion on the dorsum of his right forearm     Assessment & Plan:  Colon cancer screening - Plan: Cologuard  Annual physical exam  Essential hypertension - Plan: CBC with Differential/Platelet, Lipid panel, COMPLETE METABOLIC PANEL WITH GFR  Prostate cancer screening - Plan: PSA  Hypothyroidism, unspecified type - Plan: TSH Patient elects to perform Cologuard screening.  I will check a PSA to screen for prostate cancer.  Blood pressure is excellent off lisinopril so of asked him to monitor daily.  As long as it stays less than 140/90 he does not need to  resume the lisinopril.  Check CBC CMP and a lipid panel along with a TSH.  Goal LDL cholesterol is less than 100.  Patient received his flu shot today along with Prevnar 20.  I recommended a COVID booster as well.  Recommended dermatology consultation for the lesion on his right forearm

## 2022-03-04 NOTE — Addendum Note (Signed)
Addended by: Randal Buba K on: 03/04/2022 10:27 AM   Modules accepted: Orders

## 2022-03-05 LAB — CBC WITH DIFFERENTIAL/PLATELET
Absolute Monocytes: 535 cells/uL (ref 200–950)
Basophils Absolute: 81 cells/uL (ref 0–200)
Basophils Relative: 1.5 %
Eosinophils Absolute: 432 cells/uL (ref 15–500)
Eosinophils Relative: 8 %
HCT: 41.3 % (ref 38.5–50.0)
Hemoglobin: 13.9 g/dL (ref 13.2–17.1)
Lymphs Abs: 1269 cells/uL (ref 850–3900)
MCH: 32 pg (ref 27.0–33.0)
MCHC: 33.7 g/dL (ref 32.0–36.0)
MCV: 94.9 fL (ref 80.0–100.0)
MPV: 10.2 fL (ref 7.5–12.5)
Monocytes Relative: 9.9 %
Neutro Abs: 3083 cells/uL (ref 1500–7800)
Neutrophils Relative %: 57.1 %
Platelets: 297 10*3/uL (ref 140–400)
RBC: 4.35 10*6/uL (ref 4.20–5.80)
RDW: 12 % (ref 11.0–15.0)
Total Lymphocyte: 23.5 %
WBC: 5.4 10*3/uL (ref 3.8–10.8)

## 2022-03-05 LAB — COMPLETE METABOLIC PANEL WITH GFR
AG Ratio: 1.5 (calc) (ref 1.0–2.5)
ALT: 16 U/L (ref 9–46)
AST: 17 U/L (ref 10–35)
Albumin: 4 g/dL (ref 3.6–5.1)
Alkaline phosphatase (APISO): 54 U/L (ref 35–144)
BUN/Creatinine Ratio: 25 (calc) — ABNORMAL HIGH (ref 6–22)
BUN: 29 mg/dL — ABNORMAL HIGH (ref 7–25)
CO2: 24 mmol/L (ref 20–32)
Calcium: 9.6 mg/dL (ref 8.6–10.3)
Chloride: 109 mmol/L (ref 98–110)
Creat: 1.14 mg/dL (ref 0.70–1.35)
Globulin: 2.7 g/dL (calc) (ref 1.9–3.7)
Glucose, Bld: 88 mg/dL (ref 65–99)
Potassium: 5.1 mmol/L (ref 3.5–5.3)
Sodium: 141 mmol/L (ref 135–146)
Total Bilirubin: 0.5 mg/dL (ref 0.2–1.2)
Total Protein: 6.7 g/dL (ref 6.1–8.1)
eGFR: 71 mL/min/{1.73_m2} (ref >=60)

## 2022-03-05 LAB — LIPID PANEL
Cholesterol: 186 mg/dL (ref <200)
HDL: 46 mg/dL (ref >=40)
LDL Cholesterol (Calc): 125 mg/dL (calc) — ABNORMAL HIGH
Non-HDL Cholesterol (Calc): 140 mg/dL (calc) — ABNORMAL HIGH (ref <130)
Total CHOL/HDL Ratio: 4 (calc) (ref <5.0)
Triglycerides: 61 mg/dL (ref <150)

## 2022-03-05 LAB — TSH: TSH: 0.78 mIU/L (ref 0.40–4.50)

## 2022-03-05 LAB — PSA: PSA: 0.44 ng/mL (ref <=4.00)

## 2022-03-14 ENCOUNTER — Other Ambulatory Visit: Payer: Self-pay

## 2022-03-14 DIAGNOSIS — E039 Hypothyroidism, unspecified: Secondary | ICD-10-CM

## 2022-03-14 MED ORDER — LEVOTHYROXINE SODIUM 125 MCG PO TABS: 125.0000 ug | ORAL_TABLET | Freq: Every day | ORAL | 3 refills | Status: DC

## 2022-03-14 NOTE — Telephone Encounter (Signed)
Pharmacy faxed a refill request for levothyroxine (SYNTHROID) 125 MCG tablet [[id number]]    Order Details Dose, Route, Frequency: As Directed  Dispense Quantity: 90 tablet Refills: 0        Sig: TAKE 1 TABLET BY MOUTH EVERY DAY       Start Date: 11/10/21 End Date: --  Written Date: 11/10/21 Expiration Date: 11/10/22  Original Order:  levothyroxine (SYNTHROID) 125 MCG tablet [[id number]]

## 2022-03-15 NOTE — Telephone Encounter (Signed)
Unable to refill per protocol, last refill by  Provider 03/14/22 for 90 and 3 rf. Will refuse duplicate request.  Requested Prescriptions  Pending Prescriptions Disp Refills  . levothyroxine (SYNTHROID) 125 MCG tablet 90 tablet 0    Sig: Take 1 tablet (125 mcg total) by mouth daily.     Endocrinology:  Hypothyroid Agents Failed - 03/14/2022  4:44 PM      Failed - Valid encounter within last 12 months    Recent Outpatient Visits          4 months ago Hypothyroidism, unspecified type   [place] Pickard, Cammie Mcgee, MD   1 year ago Upper respiratory tract infection, unspecified type   [place] Eulogio Bear, NP   1 year ago Essential hypertension   [place] [facility] Schaumann, Cammie Mcgee, MD   2 years ago Annual physical exam   [place] Susy Frizzle, MD   3 years ago Ingrown toenail of both [place], Cammie Mcgee, MD      Future Appointments            In 12 months Pickard, Cammie Mcgee, MD [place]           Passed - TSH in normal range and within 360 days    TSH  Date Value Ref Range Status  03/04/2022 0.78 0.40 - 4.50 mIU/L Final

## 2022-03-20 DIAGNOSIS — Z1211 Encounter for screening for malignant neoplasm of colon: Secondary | ICD-10-CM | POA: Diagnosis not present

## 2022-03-27 LAB — COLOGUARD: COLOGUARD: NEGATIVE

## 2022-05-13 ENCOUNTER — Other Ambulatory Visit: Payer: Self-pay

## 2022-05-13 DIAGNOSIS — E039 Hypothyroidism, unspecified: Secondary | ICD-10-CM

## 2022-05-13 MED ORDER — LEVOTHYROXINE SODIUM 125 MCG PO TABS: 125.0000 ug | ORAL_TABLET | Freq: Every day | ORAL | 3 refills | Status: DC

## 2022-07-26 DIAGNOSIS — L821 Other seborrheic keratosis: Secondary | ICD-10-CM | POA: Diagnosis not present

## 2022-07-26 DIAGNOSIS — D1801 Hemangioma of skin and subcutaneous tissue: Secondary | ICD-10-CM | POA: Diagnosis not present

## 2022-07-26 DIAGNOSIS — L570 Actinic keratosis: Secondary | ICD-10-CM | POA: Diagnosis not present

## 2022-07-26 DIAGNOSIS — D485 Neoplasm of uncertain behavior of skin: Secondary | ICD-10-CM | POA: Diagnosis not present

## 2022-07-26 DIAGNOSIS — D0461 Carcinoma in situ of skin of right upper limb, including shoulder: Secondary | ICD-10-CM | POA: Diagnosis not present

## 2022-12-09 DIAGNOSIS — R69 Illness, unspecified: Secondary | ICD-10-CM | POA: Diagnosis not present

## 2023-01-05 ENCOUNTER — Other Ambulatory Visit: Payer: Self-pay | Admitting: Adult Health

## 2023-01-06 DIAGNOSIS — R69 Illness, unspecified: Secondary | ICD-10-CM | POA: Diagnosis not present

## 2023-01-19 ENCOUNTER — Ambulatory Visit (INDEPENDENT_AMBULATORY_CARE_PROVIDER_SITE_OTHER): Payer: Medicare HMO | Admitting: Family Medicine

## 2023-01-19 VITALS — BP 146/88 | HR 77 | Temp 97.8°F | Ht 68.0 in | Wt 178.8 lb

## 2023-01-19 DIAGNOSIS — J40 Bronchitis, not specified as acute or chronic: Secondary | ICD-10-CM

## 2023-01-19 MED ORDER — DOXYCYCLINE HYCLATE 100 MG PO TABS: 100.0000 mg | ORAL_TABLET | Freq: Two times a day (BID) | ORAL | 0 refills | Status: DC

## 2023-01-19 NOTE — Progress Notes (Signed)
Subjective:    Patient ID: Edgar Miller, male    DOB: [birth year], 67 y.o.   MRN: [id number]  Cough  Patient has had a cough for 1-1/2 weeks ever since he was with his grandchildren.  One of his grandparents was hospitalized with pneumonia due to hypoxia.  Patient reports shortness of breath denies any fever.  Cough is productive but he denies purulent sputum.  Denies any pleurisy or hemoptysis.  Denies any high-grade fever.  He has been taking Mucinex and other cough medicines for the last 10 to 11 days without any benefit Past Medical History:  Diagnosis Date   Allergic rhinitis    Allergy    Phreesia 03/02/2020   Clotting disorder ([facility])    Phreesia 03/02/2020   Hypertension    Hypothyroidism    Pulmonary embolism ([facility])    Thyroid disease    Phreesia 03/02/2020   Past Surgical History:  Procedure Laterality Date   ANKLE SURGERY Right    Heart ablation     KNEE ARTHROSCOPY WITH MENISCAL REPAIR     THUMB ARTHROSCOPY Left    pins   Current Outpatient Medications on File Prior to Visit  Medication Sig Dispense Refill   COMIRNATY syringe      levocetirizine (XYZAL) 5 MG tablet TAKE 1 TABLET BY MOUTH EVERY DAY 90 tablet 3   levothyroxine (SYNTHROID) 125 MCG tablet Take 1 tablet (125 mcg total) by mouth daily. 90 tablet 3   lisinopril (ZESTRIL) 20 MG tablet Take 1 tablet (20 mg total) by mouth daily. 90 tablet 3   methylPREDNISolone (MEDROL DOSEPAK) 4 MG TBPK tablet 6 day dose pack - take as directed 21 tablet 0   rivaroxaban (XARELTO) 20 MG TABS tablet TAKE ONE TABLET BY MOUTH ONCE DAILY WITH SUPPER 90 tablet 0   No current facility-administered medications on file prior to visit.   Allergies  Allergen Reactions   Codeine Nausea And Vomiting   Penicillins Other (See Comments)    Childhood allergy   Social History   Socioeconomic History   Marital status: Married    Spouse name: Not on file   Number of children: 5   Years of education: Not on file   Highest education  level: Not on file  Occupational History   Occupation: chief Radiographer, therapeutic  Tobacco Use   Smoking status: Never   Smokeless tobacco: Never  Vaping Use   Vaping status: Never Used  Substance and Sexual Activity   Alcohol use: No   Drug use: No   Sexual activity: Yes  Other Topics Concern   Not on file  Social History Narrative   Not on file   Social Determinants of Health   Financial Resource Strain: Not on file  Food Insecurity: Not on file  Transportation Needs: Not on file  Physical Activity: Not on file  Stress: Not on file  Social Connections: Not on file  Intimate Partner Violence: Not on file     Family History  Problem Relation Age of Onset   Stroke Mother    Hypothyroidism Mother    Heart failure Father    Atrial fibrillation Father    Prostate cancer Paternal Grandfather    Bladder Cancer Paternal Grandfather    Kidney disease Maternal Grandfather     Review of Systems  Respiratory:  Positive for cough.   All other systems reviewed and are negative.      Objective:   Physical Exam Vitals reviewed.  Constitutional:  General: He is not in acute distress.    Appearance: He is well-developed. He is not diaphoretic.  HENT:     Head: Normocephalic and atraumatic.     Right Ear: Tympanic membrane, ear canal and external ear normal.     Left Ear: Tympanic membrane, ear canal and external ear normal.     Nose: Nose normal. No congestion or rhinorrhea.     Mouth/Throat:     Pharynx: No oropharyngeal exudate.  Eyes:     General: No scleral icterus.       Right eye: No discharge.        Left eye: No discharge.     Conjunctiva/sclera: Conjunctivae normal.     Pupils: Pupils are equal, round, and reactive to light.  Neck:     Thyroid: No thyromegaly.     Vascular: No JVD.     Trachea: No tracheal deviation.  Cardiovascular:     Rate and Rhythm: Normal rate and regular rhythm.     Heart sounds: Normal heart sounds. No murmur heard.    No  friction rub. No gallop.  Pulmonary:     Effort: Pulmonary effort is normal. No respiratory distress.     Breath sounds: No stridor. Rales present. No wheezing.    Chest:     Chest wall: No tenderness.  Abdominal:     General: Bowel sounds are normal. There is no distension.     Palpations: Abdomen is soft. There is no mass.     Tenderness: There is no abdominal tenderness. There is no rebound.  Musculoskeletal:        General: No tenderness or deformity. Normal range of motion.     Cervical back: Normal range of motion and neck supple.  Lymphadenopathy:     Cervical: No cervical adenopathy.  Skin:    General: Skin is warm.     Coloration: Skin is not pale.     Findings: No erythema or rash.  Neurological:     Mental Status: He is alert and oriented to person, place, and time.     Cranial Nerves: No cranial nerve deficit.     Motor: No abnormal muscle tone.     Coordination: Coordination normal.     Deep Tendon Reflexes: Reflexes are normal and symmetric.  Psychiatric:        Behavior: Behavior normal.        Thought Content: Thought content normal.        Judgment: Judgment normal.           Assessment & Plan:  Bronchitis I believe the patient has bronchitis.  Begin doxycycline 100 mg twice daily for 7 days.  Continue Mucinex

## 2023-01-26 DIAGNOSIS — L570 Actinic keratosis: Secondary | ICD-10-CM | POA: Diagnosis not present

## 2023-01-26 DIAGNOSIS — L821 Other seborrheic keratosis: Secondary | ICD-10-CM | POA: Diagnosis not present

## 2023-01-26 DIAGNOSIS — Z85828 Personal history of other malignant neoplasm of skin: Secondary | ICD-10-CM | POA: Diagnosis not present

## 2023-03-08 DIAGNOSIS — Z0100 Encounter for examination of eyes and vision without abnormal findings: Secondary | ICD-10-CM | POA: Diagnosis not present

## 2023-03-10 ENCOUNTER — Ambulatory Visit (INDEPENDENT_AMBULATORY_CARE_PROVIDER_SITE_OTHER): Payer: Medicare HMO | Admitting: Family Medicine

## 2023-03-10 ENCOUNTER — Encounter: Payer: Self-pay | Admitting: Family Medicine

## 2023-03-10 VITALS — BP 132/86 | HR 63 | Temp 98.6°F | Ht 68.0 in | Wt 177.0 lb

## 2023-03-10 DIAGNOSIS — E039 Hypothyroidism, unspecified: Secondary | ICD-10-CM | POA: Diagnosis not present

## 2023-03-10 DIAGNOSIS — Z125 Encounter for screening for malignant neoplasm of prostate: Secondary | ICD-10-CM | POA: Diagnosis not present

## 2023-03-10 DIAGNOSIS — Z23 Encounter for immunization: Secondary | ICD-10-CM | POA: Diagnosis not present

## 2023-03-10 DIAGNOSIS — Z0001 Encounter for general adult medical examination with abnormal findings: Secondary | ICD-10-CM

## 2023-03-10 DIAGNOSIS — Z0000 Encounter for general adult medical examination without abnormal findings: Secondary | ICD-10-CM

## 2023-03-10 DIAGNOSIS — I10 Essential (primary) hypertension: Secondary | ICD-10-CM

## 2023-03-10 NOTE — Progress Notes (Signed)
Subjective:    Patient ID: Edgar Miller, male    DOB: January 25, 1956, 67 y.o.   MRN: [id number]  HPI Patient is a very pleasant 67 year old white male here today for a check up. Past medical history significant for hypothyroidism, essential hypertension, and also DVT/PE 2. The first time he had a DVT was after prolonged car rides back and forth to [place]. After prolonged immobility, he developed a DVT in his left leg he believes with resultant pulmonary embolism. 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 After that decision he decided to remain on lifelong anticoagulation.  Patient had a Cologuard last year.  This was negative.  He is due for a PSA to screen for prostate cancer.  He denies any chest pain shortness of breath or dyspnea on exertion.  He denies any bleeding.  He denies any melena or hematochezia.  Blood pressure today is excellent.  Unfortunately his wife was recently diagnosed with a mass in her pancreas.  Workup for this is pending.  Patient is due today for a flu shot.  He is also due for COVID vaccination.  He defers the COVID vaccination at present time.  Would like to do the flu shot today.  He denies any falls, memory loss, or depression.  He is still active and playing tennis regularly although he is slowed by meniscal tear in his knee  Past Medical History:  Diagnosis Date   Allergic rhinitis    Allergy    Phreesia 03/02/2020   Clotting disorder ([facility])    Phreesia 03/02/2020   Hypertension    Hypothyroidism    Pulmonary embolism ([facility])    Thyroid disease    Phreesia 03/02/2020   Past Surgical History:  Procedure Laterality Date   ANKLE SURGERY Right    Heart ablation     KNEE ARTHROSCOPY WITH MENISCAL REPAIR     THUMB ARTHROSCOPY Left    pins   Current Outpatient Medications on File Prior to Visit  Medication Sig Dispense Refill   levocetirizine (XYZAL) 5 MG tablet TAKE 1  TABLET BY MOUTH EVERY DAY 90 tablet 3   levothyroxine (SYNTHROID) 125 MCG tablet Take 1 tablet (125 mcg total) by mouth daily. 90 tablet 3   rivaroxaban (XARELTO) 20 MG TABS tablet TAKE ONE TABLET BY MOUTH ONCE DAILY WITH SUPPER 90 tablet 0   COMIRNATY syringe  (Patient not taking: Reported on 03/10/2023)     lisinopril (ZESTRIL) 20 MG tablet Take 1 tablet (20 mg total) by mouth daily. (Patient not taking: Reported on 03/10/2023) 90 tablet 3   methylPREDNISolone (MEDROL DOSEPAK) 4 MG TBPK tablet 6 day dose pack - take as directed (Patient not taking: Reported on 03/10/2023) 21 tablet 0   No current facility-administered medications on file prior to visit.   Allergies  Allergen Reactions   Codeine Nausea And Vomiting   Penicillins Other (See Comments)    Childhood allergy   Social History   Socioeconomic History   Marital status: Married    Spouse name: Not on file   Number of children: 5   Years of education: Not on file   Highest education level: Not on file  Occupational History   Occupation: chief Radiographer, therapeutic  Tobacco Use   Smoking status: Never   Smokeless tobacco: Never  Vaping Use   Vaping status: Never Used  Substance and Sexual Activity   Alcohol use: No   Drug use: No  Sexual activity: Yes  Other Topics Concern   Not on file  Social History Narrative   Not on file   Social Determinants of Health   Financial Resource Strain: Not on file  Food Insecurity: Not on file  Transportation Needs: Not on file  Physical Activity: Not on file  Stress: Not on file  Social Connections: Not on file  Intimate Partner Violence: Not on file     Family History  Problem Relation Age of Onset   Stroke Mother    Hypothyroidism Mother    Heart failure Father    Atrial fibrillation Father    Prostate cancer Paternal Grandfather    Bladder Cancer Paternal Grandfather    Kidney disease Maternal Grandfather     Review of Systems  All other systems reviewed and are  negative.      Objective:   Physical Exam Vitals reviewed.  Constitutional:      General: He is not in acute distress.    Appearance: He is well-developed. He is not diaphoretic.  HENT:     Head: Normocephalic and atraumatic.     Right Ear: External ear normal.     Left Ear: External ear normal.     Nose: Nose normal.     Mouth/Throat:     Pharynx: No oropharyngeal exudate.  Eyes:     General: No scleral icterus.       Right eye: No discharge.        Left eye: No discharge.     Conjunctiva/sclera: Conjunctivae normal.     Pupils: Pupils are equal, round, and reactive to light.  Neck:     Thyroid: No thyromegaly.     Vascular: No JVD.     Trachea: No tracheal deviation.  Cardiovascular:     Rate and Rhythm: Normal rate and regular rhythm.     Heart sounds: Normal heart sounds. No murmur heard.    No friction rub. No gallop.  Pulmonary:     Effort: Pulmonary effort is normal. No respiratory distress.     Breath sounds: Normal breath sounds. No stridor. No wheezing or rales.  Chest:     Chest wall: No tenderness.  Abdominal:     General: Bowel sounds are normal. There is no distension.     Palpations: Abdomen is soft. There is no mass.     Tenderness: There is no abdominal tenderness. There is no rebound.  Musculoskeletal:        General: No tenderness or deformity. Normal range of motion.     Cervical back: Normal range of motion and neck supple.  Lymphadenopathy:     Cervical: No cervical adenopathy.  Skin:    General: Skin is warm.     Coloration: Skin is not pale.     Findings: No erythema or rash.  Neurological:     Mental Status: He is alert and oriented to person, place, and time.     Cranial Nerves: No cranial nerve deficit.     Motor: No abnormal muscle tone.     Coordination: Coordination normal.     Deep Tendon Reflexes: Reflexes are normal and symmetric.  Psychiatric:        Behavior: Behavior normal.        Thought Content: Thought content normal.         Judgment: Judgment normal.       Assessment & Plan:  Hypothyroidism, unspecified type - Plan: TSH  Prostate cancer screening - Plan: PSA  Essential hypertension -  Plan: CBC with Differential/Platelet, COMPLETE METABOLIC PANEL WITH GFR, Lipid panel  Flu vaccine need - Plan: Flu Vaccine Trivalent High Dose (Fluad)  Annual physical exam Patient received his flu shot today.  I recommended a COVID-vaccine at his earliest convenience.  Cologuard is due again in 2026.  Check a PSA today.  Blood pressure is excellent.  I will check a CBC a CMP and a lipid panel.  I will also check a TSH to ensure adequate dosage of his levothyroxine.  Regular anticipatory guidance provided.  Otherwise patient is to be doing well.

## 2023-03-11 LAB — COMPLETE METABOLIC PANEL WITH GFR
AG Ratio: 1.4 (calc) (ref 1.0–2.5)
ALT: 13 U/L (ref 9–46)
AST: 16 U/L (ref 10–35)
Albumin: 3.7 g/dL (ref 3.6–5.1)
Alkaline phosphatase (APISO): 58 U/L (ref 35–144)
BUN: 23 mg/dL (ref 7–25)
CO2: 25 mmol/L (ref 20–32)
Calcium: 9.2 mg/dL (ref 8.6–10.3)
Chloride: 108 mmol/L (ref 98–110)
Creat: 1.09 mg/dL (ref 0.70–1.35)
Globulin: 2.6 g/dL (ref 1.9–3.7)
Glucose, Bld: 86 mg/dL (ref 65–99)
Potassium: 4.5 mmol/L (ref 3.5–5.3)
Sodium: 139 mmol/L (ref 135–146)
Total Bilirubin: 0.6 mg/dL (ref 0.2–1.2)
Total Protein: 6.3 g/dL (ref 6.1–8.1)
eGFR: 74 mL/min/{1.73_m2} (ref >=60)

## 2023-03-11 LAB — CBC WITH DIFFERENTIAL/PLATELET
Absolute Monocytes: 636 {cells}/uL (ref 200–950)
Basophils Absolute: 101 {cells}/uL (ref 0–200)
Basophils Relative: 1.9 %
Eosinophils Absolute: 281 {cells}/uL (ref 15–500)
Eosinophils Relative: 5.3 %
HCT: 42.9 % (ref 38.5–50.0)
Hemoglobin: 14 g/dL (ref 13.2–17.1)
Lymphs Abs: 1309 {cells}/uL (ref 850–3900)
MCH: 31.3 pg (ref 27.0–33.0)
MCHC: 32.6 g/dL (ref 32.0–36.0)
MCV: 96 fL (ref 80.0–100.0)
MPV: 10.4 fL (ref 7.5–12.5)
Monocytes Relative: 12 %
Neutro Abs: 2973 {cells}/uL (ref 1500–7800)
Neutrophils Relative %: 56.1 %
Platelets: 276 10*3/uL (ref 140–400)
RBC: 4.47 10*6/uL (ref 4.20–5.80)
RDW: 12.1 % (ref 11.0–15.0)
Total Lymphocyte: 24.7 %
WBC: 5.3 10*3/uL (ref 3.8–10.8)

## 2023-03-11 LAB — TSH: TSH: 1.09 m[IU]/L (ref 0.40–4.50)

## 2023-03-11 LAB — LIPID PANEL
Cholesterol: 175 mg/dL (ref <200)
HDL: 45 mg/dL (ref >=40)
LDL Cholesterol (Calc): 115 mg/dL — ABNORMAL HIGH
Non-HDL Cholesterol (Calc): 130 mg/dL — ABNORMAL HIGH (ref <130)
Total CHOL/HDL Ratio: 3.9 (calc) (ref <5.0)
Triglycerides: 49 mg/dL (ref <150)

## 2023-03-11 LAB — PSA: PSA: 0.47 ng/mL (ref <=4.00)

## 2023-03-16 ENCOUNTER — Ambulatory Visit: Payer: Medicare HMO

## 2023-03-16 VITALS — Ht 68.0 in | Wt 177.0 lb

## 2023-03-16 DIAGNOSIS — Z0000 Encounter for general adult medical examination without abnormal findings: Secondary | ICD-10-CM

## 2023-03-16 NOTE — Progress Notes (Signed)
Subjective:   Edgar Miller is a 66 y.o. male who presents for Medicare Annual/Subsequent preventive examination.  Visit Complete: Virtual I connected with  Edgar Miller on 03/16/23 by a audio enabled telemedicine application and verified that I am speaking with the correct person using two identifiers.  Patient Location: Home  Provider Location: Home Office  I discussed the limitations of evaluation and management by telemedicine. The patient expressed understanding and agreed to proceed.  Vital Signs: Because this visit was a virtual/telehealth visit, some criteria may be missing or patient reported. Any vitals not documented were not able to be obtained and vitals that have been documented are patient reported.  Cardiac Risk Factors include: male gender;advanced age (>12men, >60 women);dyslipidemia;hypertension     Objective:    Today's Vitals   03/16/23 0832  Weight: 177 lb (80.3 kg)  Height: 5\' 8"  (1.727 m)   Body mass index is 26.91 kg/m.     03/16/2023    8:51 AM 03/06/2015    3:32 PM 09/01/2014    3:23 PM 08/15/2014   12:43 AM 08/14/2014    7:56 PM  Advanced Directives  Does Patient Have a Medical Advance Directive? No Yes No No No  Type of Special educational needs teacher of [place];Living will     Would patient like information on creating a medical advance directive? Yes (MAU/Ambulatory/Procedural Areas - Information given)   No - patient declined information No - patient declined information    Current Medications (verified) Outpatient Encounter Medications as of 03/16/2023  Medication Sig   levocetirizine (XYZAL) 5 MG tablet TAKE 1 TABLET BY MOUTH EVERY DAY   levothyroxine (SYNTHROID) 125 MCG tablet Take 1 tablet (125 mcg total) by mouth daily.   lisinopril (ZESTRIL) 20 MG tablet Take 1 tablet (20 mg total) by mouth daily.   rivaroxaban (XARELTO) 20 MG TABS tablet TAKE ONE TABLET BY MOUTH ONCE DAILY WITH SUPPER   [DISCONTINUED] COMIRNATY syringe     [DISCONTINUED] methylPREDNISolone (MEDROL DOSEPAK) 4 MG TBPK tablet 6 day dose pack - take as directed   No facility-administered encounter medications on file as of 03/16/2023.    Allergies (verified) Codeine and Penicillins   History: Past Medical History:  Diagnosis Date   Allergic rhinitis    Allergy    Phreesia 03/02/2020   Clotting disorder (HCC)    Phreesia 03/02/2020   Hypertension    Hypothyroidism    Pulmonary embolism (HCC)    Thyroid disease    Phreesia 03/02/2020   Past Surgical History:  Procedure Laterality Date   ANKLE SURGERY Right    Heart ablation     KNEE ARTHROSCOPY WITH MENISCAL REPAIR     THUMB ARTHROSCOPY Left    pins   Family History  Problem Relation Age of Onset   Stroke Mother    Hypothyroidism Mother    Heart failure Father    Atrial fibrillation Father    Prostate cancer Paternal Grandfather    Bladder Cancer Paternal Grandfather    Kidney disease Maternal Grandfather    Social History   Socioeconomic History   Marital status: Married    Spouse name: Not on file   Number of children: 5   Years of education: Not on file   Highest education level: Not on file  Occupational History   Occupation: chief Radiographer, therapeutic  Tobacco Use   Smoking status: Never   Smokeless tobacco: Never  Vaping Use   Vaping status: Never Used  Substance and Sexual Activity  Alcohol use: No   Drug use: No   Sexual activity: Yes  Other Topics Concern   Not on file  Social History Narrative   Not on file   Social Determinants of Health   Financial Resource Strain: Low Risk  (03/16/2023)   Overall Financial Resource Strain (CARDIA)    Difficulty of Paying Living Expenses: Not hard at all  Food Insecurity: No Food Insecurity (03/16/2023)   Hunger Vital Sign    Worried About Running Out of Food in the Last Year: Never true    Ran Out of Food in the Last Year: Never true  Transportation Needs: No Transportation Needs (03/16/2023)   PRAPARE -  Administrator, Civil Service (Medical): No    Lack of Transportation (Non-Medical): No  Physical Activity: Insufficiently Active (03/16/2023)   Exercise Vital Sign    Days of Exercise per Week: 2 days    Minutes of Exercise per Session: 30 min  Stress: No Stress Concern Present (03/16/2023)   [employer] of Occupational Health - Occupational Stress Questionnaire    Feeling of Stress : Not at all  Social Connections: Moderately Integrated (03/16/2023)   Social Connection and Isolation Panel [NHANES]    Frequency of Communication with Friends and Family: More than three times a week    Frequency of Social Gatherings with Friends and Family: Three times a week    Attends Religious Services: 1 to 4 times per year    Active Member of Clubs or Organizations: No    Attends Banker Meetings: Never    Marital Status: Married    Tobacco Counseling Counseling given: Not Answered   Clinical Intake:  Pre-visit preparation completed: Yes  Pain : No/denies pain     Diabetes: No  How often do you need to have someone help you when you read instructions, pamphlets, or other written materials from your doctor or pharmacy?: 1 - Never  Interpreter Needed?: No  Information entered by :: Kandis Fantasia LPN   Activities of Daily Living    03/16/2023    8:33 AM  In your present state of health, do you have any difficulty performing the following activities:  Hearing? 0  Vision? 0  Difficulty concentrating or making decisions? 0  Walking or climbing stairs? 0  Dressing or bathing? 0  Doing errands, shopping? 0  Preparing Food and eating ? N  Using the Toilet? N  In the past six months, have you accidently leaked urine? N  Do you have problems with loss of bowel control? N  Managing your Medications? N  Managing your Finances? N  Housekeeping or managing your Housekeeping? N    Patient Care Team: Donita Brooks, MD as PCP - General (Family  Medicine)  Indicate any recent Medical Services you may have received from other than [facility] providers in the past year (date may be approximate).     Assessment:   This is a routine wellness examination for Edgar Miller.  Hearing/Vision screen Hearing Screening - Comments:: Denies hearing difficulties   Vision Screening - Comments:: up to date with routine eye exams with [facility]     Goals Addressed             This Visit's Progress    Remain active and independent        Depression Screen    03/16/2023    8:50 AM 03/10/2023   12:35 PM 01/19/2023   10:03 AM 03/04/2022    8:36 AM  10/30/2020    7:58 AM 03/03/2020    8:30 AM 08/02/2017    4:17 PM  PHQ 2/9 Scores  PHQ - 2 Score 0 0 0 0 0 0 0  PHQ- 9 Score       0    Fall Risk    03/16/2023    8:51 AM 03/10/2023   12:35 PM 01/19/2023   10:03 AM 10/30/2020    7:58 AM 03/03/2020    8:30 AM  Fall Risk   Falls in the past year? 0 0 0 0 0  Number falls in past yr: 0 0 0 0 0  Injury with Fall? 0 0 0 0 0  Risk for fall due to : No Fall Risks No Fall Risks No Fall Risks No Fall Risks   Follow up Falls prevention discussed;Education provided;Falls evaluation completed Falls prevention discussed Falls prevention discussed Falls evaluation completed     MEDICARE RISK AT HOME: Medicare Risk at Home Any stairs in or around the home?: No If so, are there any without handrails?: No Home free of loose throw rugs in walkways, pet beds, electrical cords, etc?: Yes Adequate lighting in your home to reduce risk of falls?: Yes Life alert?: No Use of a cane, walker or w/c?: No Grab bars in the bathroom?: Yes Shower chair or bench in shower?: No Elevated toilet seat or a handicapped toilet?: Yes  TIMED UP AND GO:  Was the test performed?  No    Cognitive Function:        03/16/2023    8:51 AM  6CIT Screen  What Year? 0 points  What month? 0 points  What time? 0 points  Count back from 20 0 points  Months in reverse 0  points  Repeat phrase 0 points  Total Score 0 points    Immunizations Immunization History  Administered Date(s) Administered   Fluad Trivalent(High Dose 65+) 03/10/2023   Influenza Split 03/30/2014, 02/28/2016   Influenza,inj,Quad PF,6+ Mos 04/28/2017, 03/03/2020   Moderna Sars-Covid-2 Vaccination 09/02/2019, 09/30/2019, 06/15/2020   PNEUMOCOCCAL CONJUGATE-20 03/04/2022   Pfizer(Comirnaty)Fall Seasonal Vaccine 12 years and older 04/14/2022   Pneumococcal Polysaccharide-23 10/30/2020   Tdap 08/02/2016   Zoster, Live 08/02/2016    TDAP status: Up to date  Flu Vaccine status: Up to date  Pneumococcal vaccine status: Up to date  Covid-19 vaccine status: Information provided on how to obtain vaccines.   Qualifies for Shingles Vaccine? Yes   Zostavax completed Yes   Shingrix Completed?: No.    Education has been provided regarding the importance of this vaccine. Patient has been advised to call insurance company to determine out of pocket expense if they have not yet received this vaccine. Advised may also receive vaccine at local pharmacy or Health Dept. Verbalized acceptance and understanding.  Screening Tests Health Maintenance  Topic Date Due   COVID-19 Vaccine (5 - 2023-24 season) 05/31/2023 (Originally 01/29/2023)   Zoster Vaccines- Shingrix (1 of 2) 06/10/2023 (Originally 03/28/2005)   Colonoscopy  03/11/2024 (Originally 05/31/2015)   Medicare Annual Wellness (AWV)  03/15/2024   DTaP/Tdap/Td (2 - Td or Tdap) 08/03/2026   Pneumonia Vaccine 27+ Years old  Completed   INFLUENZA VACCINE  Completed   HPV VACCINES  Aged Out   Hepatitis C Screening  Discontinued    Health Maintenance  There are no preventive care reminders to display for this patient.  Colorectal cancer screening:  Patient would like to postpone for now   Lung Cancer Screening: (Low Dose CT Chest  recommended if Age 65-80 years, 20 pack-year currently smoking OR have quit w/in 15years.) does not qualify.    Lung Cancer Screening Referral: n/a  Additional Screening:  Hepatitis C Screening: does not qualify  Vision Screening: Recommended annual ophthalmology exams for early detection of glaucoma and other disorders of the eye. Is the patient up to date with their annual eye exam?  Yes  Who is the provider or what is the name of the office in which the patient attends annual eye exams? [facility]  If pt is not established with a provider, would they like to be referred to a provider to establish care? No .   Dental Screening: Recommended annual dental exams for proper oral hygiene  Community Resource Referral / Chronic Care Management: CRR required this visit?  No   CCM required this visit?  No     Plan:     I have personally reviewed and noted the following in the patient's chart:   Medical and social history Use of alcohol, tobacco or illicit drugs  Current medications and supplements including opioid prescriptions. Patient is not currently taking opioid prescriptions. Functional ability and status Nutritional status Physical activity Advanced directives List of other physicians Hospitalizations, surgeries, and ER visits in previous 12 months Vitals Screenings to include cognitive, depression, and falls Referrals and appointments  In addition, I have reviewed and discussed with patient certain preventive protocols, quality metrics, and best practice recommendations. A written personalized care plan for preventive services as well as general preventive health recommendations were provided to patient.     Kandis Fantasia [place], [place]   16/02/9603   After Visit Summary: (MyChart) Due to this being a telephonic visit, the after visit summary with patients personalized plan was offered to patient via MyChart   Nurse Notes: No concerns at this time

## 2023-03-16 NOTE — Patient Instructions (Signed)
Mr. Edgar Miller , Thank you for taking time to come for your Medicare Wellness Visit. I appreciate your ongoing commitment to your health goals. Please review the following plan we discussed and let me know if I can assist you in the future.   Referrals/Orders/Follow-Ups/Clinician Recommendations: Aim for 30 minutes of exercise or brisk walking, 6-8 glasses of water, and 5 servings of fruits and vegetables each day.  This is a list of the screening recommended for you and due dates:  Health Maintenance  Topic Date Due   COVID-19 Vaccine (5 - 2023-24 season) 05/31/2023*   Zoster (Shingles) Vaccine (1 of 2) 06/10/2023*   Colon Cancer Screening  03/11/2024*   Medicare Annual Wellness Visit  03/15/2024   DTaP/Tdap/Td vaccine (2 - Td or Tdap) 08/03/2026   Pneumonia Vaccine  Completed   Flu Shot  Completed   HPV Vaccine  Aged Out   Hepatitis C Screening  Discontinued  *Topic was postponed. The date shown is not the original due date.    Advanced directives: (ACP Link)Information on Advanced Care Planning can be found at [facility] [place] Advance Health Care Directives Advance Health Care Directives ([email or web address]   Next Medicare Annual Wellness Visit scheduled for next year: Yes

## 2023-04-11 ENCOUNTER — Other Ambulatory Visit: Payer: Self-pay | Admitting: Adult Health

## 2023-07-10 ENCOUNTER — Other Ambulatory Visit: Payer: Self-pay | Admitting: Adult Health

## 2023-07-10 ENCOUNTER — Telehealth: Payer: Self-pay | Admitting: Adult Health

## 2023-07-10 NOTE — Telephone Encounter (Signed)
 I called and spoke with the pt. Pt states he needs a refill on his Xarelto . Pt has not been seen since 11-18-2021. This was also discontinued on 04-11-23. Pt is scheduled to see Tammy on 07-21-23. I informed pt that we have not seen him in almost 2 years so I could not just grant him refills. Pt states our office never asked to f/u with him and we just give him refills. I advised the pt that I would have to let his provider advise the next steps. Pt verbalized understanding. Routing to TP

## 2023-07-10 NOTE — Telephone Encounter (Signed)
 Patient states needs refill for Xarelto . Pharmacy is [facility] [address]. Patient scheduled 07/21/2023 with Roena Clark NP, Patient phone number is [phone].

## 2023-07-11 MED ORDER — RIVAROXABAN 20 MG PO TABS: 20.0000 mg | ORAL_TABLET | Freq: Every day | ORAL | 0 refills | Status: AC

## 2023-07-11 NOTE — Telephone Encounter (Signed)
LOV advised 1 year follow up . Per OV note lifelong medication for Xarelto - for recurrent PE. Need yearly ov for evaluation and refills. Patient called and aware   Patient is still taking Xarelto, not sure why medication was removed from from [facility] in November   I sent in refill and will discuss with him this month at ov    He is taking with no known issues. Rx sent  Renal fxn nml 02/2023

## 2023-07-21 ENCOUNTER — Ambulatory Visit: Payer: Medicare HMO | Admitting: Adult Health

## 2023-07-21 ENCOUNTER — Encounter: Payer: Self-pay | Admitting: Adult Health

## 2023-07-21 VITALS — BP 150/88 | HR 56 | Temp 97.6°F | Ht 68.0 in | Wt 180.0 lb

## 2023-07-21 DIAGNOSIS — I2699 Other pulmonary embolism without acute cor pulmonale: Secondary | ICD-10-CM

## 2023-07-21 MED ORDER — RIVAROXABAN 20 MG PO TABS: 20.0000 mg | ORAL_TABLET | Freq: Every day | ORAL | 3 refills | Status: AC

## 2023-07-21 NOTE — Patient Instructions (Signed)
Avoid NSAIDS (advil, ibuprofen, aleve,naproxen , excedrin, etc)  These can cause bleeding.  Continue on Xarelto 20mg daily  Follow up Dr. Byrum or Velina Drollinger NP   In  1 year and as needed   Please contact office for sooner follow up if symptoms do not improve or worsen or seek emergency care  

## 2023-07-21 NOTE — Progress Notes (Signed)
@Patient  ID: Edgar Miller, male    DOB: 1956-01-18, 67 y.o.   MRN: [id number]  Chief Complaint  Patient presents with   Follow-up    Referring provider: Donita Brooks, MD  HPI: 68 year old male followed for recurrent PE.  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 This was second PE . Extensive travel to [place]   CT angiogram of the chest showed pulmonary embolism involving bilateral pulmonary arteries.  Venous Doppler showed a positive right lower extremity DVT-occlusive DVT with the proximal posterior tibial vein. 2-D echo showed an EF of 55-60%, right ventricle moderately dilated., PAP 49  CT chest 02/2015 neg PE , Lung clear  Echo 02/2015 EF nml , PAP nml , mild TV regurg  ANA and prothrombin gene mutation was negative. Sedimentation rate was 18.  07/21/2023 Follow up : Recurrent PE  Patient presents for a follow-up visit.  Last seen June 2023.  Has a history of recurrent PE and DVT.  He had a second PE and DVT in 2016.  Remains on lifelong anticoagulant therapy with Xarelto.  Previous echo showed right ventricle moderate dilation with elevated pulmonary artery pressure.  A repeat echo in 2016 showed normal pulmonary artery pressure and RV size.  Since last visit patient says he continues to do well.  Remains very active.  Works full-time.  Also plays golf and tennis.  Lab work in October 2024 showed normal CBC with normal hemoglobin and hematocrit.  Patient denies any known bleeding.  Under some stress as wife is recently been diagnosed with pancreatic cancer undergoing  active treatment   Allergies  Allergen Reactions   Codeine Nausea And Vomiting   Penicillins Other (See Comments)    Childhood allergy    Immunization History  Administered Date(s) Administered   Fluad Trivalent(High Dose 65+) 03/10/2023   Influenza Split 03/30/2014, 02/28/2016   Influenza,inj,Quad PF,6+ Mos 04/28/2017, 03/03/2020   Moderna Sars-Covid-2 Vaccination 09/02/2019, 09/30/2019, 06/15/2020   PNEUMOCOCCAL CONJUGATE-20 03/04/2022   Pfizer(Comirnaty)Fall Seasonal Vaccine 12 years and older 04/14/2022   Pneumococcal Polysaccharide-23 10/30/2020   Tdap 08/02/2016   Zoster, Live 08/02/2016    Past Medical History:  Diagnosis Date   Allergic rhinitis    Allergy    Phreesia 03/02/2020   Clotting disorder (HCC)    Phreesia 03/02/2020   Hypertension    Hypothyroidism    Pulmonary embolism (HCC)    Thyroid disease    Phreesia 03/02/2020    Tobacco History: Social History   Tobacco Use  Smoking Status Never  Smokeless Tobacco Never   Counseling given: Not Answered   Outpatient Medications Prior to Visit  Medication Sig Dispense Refill   levocetirizine (XYZAL) 5 MG tablet TAKE 1 TABLET BY MOUTH EVERY DAY 90 tablet 3   levothyroxine (SYNTHROID) 125 MCG tablet Take 1 tablet (125 mcg total) by mouth daily. 90 tablet 3   rivaroxaban (XARELTO) 20 MG TABS tablet Take 1 tablet (20 mg total) by mouth daily with supper. 90 tablet 0   lisinopril (ZESTRIL) 20 MG tablet Take 1 tablet (20 mg total) by mouth daily. 90 tablet 3   No facility-administered  medications prior to visit.     Review of Systems:   Constitutional:   No  weight loss, night sweats,  Fevers, chills, fatigue, or  lassitude.  HEENT:   No headaches,  Difficulty swallowing,  Tooth/dental problems, or  Sore throat,                No sneezing, itching, ear ache, nasal congestion, post nasal drip,   CV:  No chest pain,  Orthopnea, PND, swelling in lower extremities, anasarca, dizziness, palpitations,  syncope.   GI  No heartburn, indigestion, abdominal pain, nausea, vomiting, diarrhea, change in bowel habits, loss of appetite, bloody stools.   Resp: No shortness of breath with exertion or at rest.  No excess mucus, no productive cough,  No non-productive cough,  No coughing up of blood.  No change in color of mucus.  No wheezing.  No chest wall deformity  Skin: no rash or lesions.  GU: no dysuria, change in color of urine, no urgency or frequency.  No flank pain, no hematuria   MS:  No joint pain or swelling.  No decreased range of motion.  No back pain.    Physical Exam  BP (!) 150/88 (BP Location: Left Arm, Patient Position: Sitting, Cuff Size: Normal)   Pulse (!) 56   Temp 97.6 F (36.4 C) (Oral)   Ht 5\' 8"  (1.727 m)   Wt 180 lb (81.6 kg)   SpO2 99%   BMI 27.37 kg/m   GEN: A/Ox3; pleasant , NAD, well nourished    HEENT:  [place]/AT,  EACs-clear, TMs-wnl, NOSE-clear, THROAT-clear, no lesions, no postnasal drip or exudate noted.   NECK:  Supple w/ fair ROM; no JVD; normal carotid impulses w/o bruits; no thyromegaly or nodules palpated; no lymphadenopathy.    RESP  Clear  P & A; w/o, wheezes/ rales/ or rhonchi. no accessory muscle use, no dullness to percussion  CARD:  RRR, no m/r/g, no peripheral edema, pulses intact, no cyanosis or clubbing.  GI:   Soft & nt; nml bowel sounds; no organomegaly or masses detected.   Musco: Warm bil, no deformities or joint swelling noted.   Neuro: alert, no focal deficits noted.    Skin: Warm, no lesions or rashes    Lab Results:  CBC   BMET  No results found for: "PROBNP"  Imaging: No results found.  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  Patient appears to be doing well.  He remains fully independent and is very active.   Recent lab work showed normal CBC.  Patient is continue on Xarelto.  Patient education given on anticoagulation therapy.  Plan  Patient Instructions  Avoid NSAIDS (advil, ibuprofen, aleve,naproxen , excedrin, etc)  These can cause bleeding.  Continue on Xarelto 20mg  daily  Follow up Dr. Delton Coombes or Colin Norment NP   In  1 year and as needed   Please contact office for sooner follow up if symptoms do not improve or worsen or seek emergency care       Rubye Oaks, NP 07/21/2023

## 2023-07-21 NOTE — Assessment & Plan Note (Signed)
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  Patient appears to be doing well.  He remains fully independent and is very active.  Recent lab work showed normal CBC.  Patient is continue on Xarelto.  Patient education given on anticoagulation therapy.  Plan  Patient Instructions  Avoid NSAIDS (advil, ibuprofen, aleve,naproxen , excedrin, etc)  These can cause bleeding.  Continue on Xarelto 20mg  daily  Follow up Dr. Delton Coombes or Ahliyah Nienow NP   In  1 year and as needed   Please contact office for sooner follow up if symptoms do not improve or worsen or seek emergency care

## 2023-07-28 ENCOUNTER — Other Ambulatory Visit: Payer: Self-pay | Admitting: Family Medicine

## 2023-07-28 DIAGNOSIS — E039 Hypothyroidism, unspecified: Secondary | ICD-10-CM

## 2023-08-28 DIAGNOSIS — D692 Other nonthrombocytopenic purpura: Secondary | ICD-10-CM | POA: Diagnosis not present

## 2023-08-28 DIAGNOSIS — D485 Neoplasm of uncertain behavior of skin: Secondary | ICD-10-CM | POA: Diagnosis not present

## 2023-08-28 DIAGNOSIS — D1801 Hemangioma of skin and subcutaneous tissue: Secondary | ICD-10-CM | POA: Diagnosis not present

## 2023-08-28 DIAGNOSIS — L821 Other seborrheic keratosis: Secondary | ICD-10-CM | POA: Diagnosis not present

## 2023-08-28 DIAGNOSIS — L218 Other seborrheic dermatitis: Secondary | ICD-10-CM | POA: Diagnosis not present

## 2023-08-28 DIAGNOSIS — L820 Inflamed seborrheic keratosis: Secondary | ICD-10-CM | POA: Diagnosis not present

## 2023-08-28 DIAGNOSIS — L570 Actinic keratosis: Secondary | ICD-10-CM | POA: Diagnosis not present

## 2024-02-01 DIAGNOSIS — L570 Actinic keratosis: Secondary | ICD-10-CM | POA: Diagnosis not present

## 2024-02-01 DIAGNOSIS — L821 Other seborrheic keratosis: Secondary | ICD-10-CM | POA: Diagnosis not present

## 2024-03-14 ENCOUNTER — Ambulatory Visit: Payer: Medicare HMO | Admitting: Family Medicine

## 2024-03-14 ENCOUNTER — Encounter: Payer: Self-pay | Admitting: Family Medicine

## 2024-03-14 VITALS — BP 140/110 | HR 80 | Temp 98.7°F | Ht 68.0 in | Wt 174.2 lb

## 2024-03-14 DIAGNOSIS — Z0000 Encounter for general adult medical examination without abnormal findings: Secondary | ICD-10-CM | POA: Diagnosis not present

## 2024-03-14 DIAGNOSIS — Z0001 Encounter for general adult medical examination with abnormal findings: Secondary | ICD-10-CM

## 2024-03-14 DIAGNOSIS — E7800 Pure hypercholesterolemia, unspecified: Secondary | ICD-10-CM

## 2024-03-14 DIAGNOSIS — Z125 Encounter for screening for malignant neoplasm of prostate: Secondary | ICD-10-CM

## 2024-03-14 DIAGNOSIS — Z23 Encounter for immunization: Secondary | ICD-10-CM

## 2024-03-14 DIAGNOSIS — E039 Hypothyroidism, unspecified: Secondary | ICD-10-CM

## 2024-03-14 DIAGNOSIS — I10 Essential (primary) hypertension: Secondary | ICD-10-CM | POA: Diagnosis not present

## 2024-03-14 NOTE — Progress Notes (Signed)
 Subjective:    Patient ID: Edgar Miller, male    DOB: 08-Jun-1955, 68 y.o.   MRN: [id number]  HPI Patient is a very pleasant 68 year old white male here today for a check up. Past medical history significant for hypothyroidism, essential hypertension, and also DVT/PE 2. The first time he had a DVT was after prolonged car rides back and forth to [place] . After prolonged immobility, he developed a DVT in his left leg he believes with resultant pulmonary embolism. 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 After that decision he decided to remain on lifelong anticoagulation.    The 10-year ASCVD risk score (Arnett DK, et al., 2019) is: 23%   Values used to calculate the score:     Age: 55 years     Clincally relevant sex: Male     Is Non-Hispanic African American: No     Diabetic: No     Tobacco smoker: No     Systolic Blood Pressure: 150 mmHg     Is BP treated: Yes     HDL Cholesterol: 45 mg/dL     Total Cholesterol: 175 mg/dL  We discussed adding a cholesterol medication based on his 10-year cardiovascular risk being greater than 10%.  Patient is hesitant to take any medication.  He states that he exercises daily, he is still very active.  He denies any chest pain or shortness of breath.  However we also discussed a coronary artery calcium score and he is interested in getting that test as a way to help risk stratify his cardiovascular risk to determine if he needs a statin.  His blood pressure is elevated today.  I verified this myself.  Patient attributes that to whitecoat syndrome and in fact he just played pickle ball.  However he can check his blood pressure at home and he would like to start checking his blood pressure daily at home before adding any medication.  He is due for flu shot, shingles vaccine.  He had Cologuard in 2023 which is due again in 2026.  He is also due for PSA.  Of note, his  wife has metastatic pancreatic cancer and they have estimated 3 months. Past Medical History:  Diagnosis Date   Allergic rhinitis    Allergy    Phreesia 03/02/2020   Clotting disorder    [employer]reesia 03/02/2020   Hypertension    Hypothyroidism    Pulmonary embolism (HCC)    Thyroid disease    [employer]reesia 03/02/2020   Past Surgical History:  Procedure Laterality Date   ANKLE SURGERY Right    Heart ablation     KNEE ARTHROSCOPY WITH MENISCAL REPAIR     THUMB ARTHROSCOPY Left    pins   Current Outpatient Medications on File Prior to Visit  Medication Sig Dispense Refill   levocetirizine (XYZAL ) 5 MG tablet TAKE 1 TABLET BY MOUTH EVERY DAY 90 tablet 3   levothyroxine  (SYNTHROID ) 125 MCG tablet TAKE 1 TABLET BY MOUTH EVERY DAY 90 tablet 3   rivaroxaban  (XARELTO ) 20 MG TABS tablet Take 1 tablet (20 mg total) by mouth daily with supper. 90 tablet 0   rivaroxaban  (XARELTO ) 20 MG TABS tablet Take 1 tablet (20 mg total) by mouth daily with supper. 90 tablet 3   No current facility-administered medications on file prior to visit.   Allergies  Allergen Reactions   Codeine Nausea And Vomiting   Penicillins Other (See Comments)  Childhood allergy   Social History   Socioeconomic History   Marital status: Married    Spouse name: Not on file   Number of children: 5   Years of education: Not on file   Highest education level: Bachelor's degree (e.g., BA, AB, BS)  Occupational History   Occupation: chief Radiographer, therapeutic  Tobacco Use   Smoking status: Never   Smokeless tobacco: Never  Vaping Use   Vaping status: Never Used  Substance and Sexual Activity   Alcohol use: No   Drug use: No   Sexual activity: Yes  Other Topics Concern   Not on file  Social History Narrative   Not on file   Social Drivers of Health   Financial Resource Strain: Low Risk  (03/13/2024)   Overall Financial Resource Strain (CARDIA)    Difficulty of Paying Living Expenses: Not hard at all  Food  Insecurity: No Food Insecurity (03/13/2024)   Hunger Vital Sign    Worried About Running Out of Food in the Last Year: Never true    Ran Out of Food in the Last Year: Never true  Transportation Needs: No Transportation Needs (03/13/2024)   PRAPARE - Administrator, Civil Service (Medical): No    Lack of Transportation (Non-Medical): No  Physical Activity: Sufficiently Active (03/13/2024)   Exercise Vital Sign    Days of Exercise per Week: 5 days    Minutes of Exercise per Session: 90 min  Stress: No Stress Concern Present (03/13/2024)   [employer] of Occupational Health - Occupational Stress Questionnaire    Feeling of Stress: Not at all  Social Connections: Socially Integrated (03/13/2024)   Social Connection and Isolation Panel    Frequency of Communication with Friends and Family: More than three times a week    Frequency of Social Gatherings with Friends and Family: Three times a week    Attends Religious Services: More than 4 times per year    Active Member of Clubs or Organizations: Yes    Attends Banker Meetings: More than 4 times per year    Marital Status: Married  Catering manager Violence: Not At Risk (03/16/2023)   Humiliation, Afraid, Rape, and Kick questionnaire    Fear of Current or Ex-Partner: No    Emotionally Abused: No    Physically Abused: No    Sexually Abused: No     Family History  Problem Relation Age of Onset   Stroke Mother    Hypothyroidism Mother    Heart failure Father    Atrial fibrillation Father    Prostate cancer Paternal Grandfather    Bladder Cancer Paternal Grandfather    Kidney disease Maternal Grandfather     Review of Systems  All other systems reviewed and are negative.      Objective:   Physical Exam Vitals reviewed.  Constitutional:      General: He is not in acute distress.    Appearance: He is well-developed. He is not diaphoretic.  HENT:     Head: Normocephalic and atraumatic.      Right Ear: External ear normal.     Left Ear: External ear normal.     Nose: Nose normal.     Mouth/Throat:     Pharynx: No oropharyngeal exudate.  Eyes:     General: No scleral icterus.       Right eye: No discharge.        Left eye: No discharge.     Conjunctiva/sclera: Conjunctivae normal.  Pupils: Pupils are equal, round, and reactive to light.  Neck:     Thyroid: No thyromegaly.     Vascular: No JVD.     Trachea: No tracheal deviation.  Cardiovascular:     Rate and Rhythm: Normal rate and regular rhythm.     Heart sounds: Normal heart sounds. No murmur heard.    No friction rub. No gallop.  Pulmonary:     Effort: Pulmonary effort is normal. No respiratory distress.     Breath sounds: Normal breath sounds. No stridor. No wheezing or rales.  Chest:     Chest wall: No tenderness.  Abdominal:     General: Bowel sounds are normal. There is no distension.     Palpations: Abdomen is soft. There is no mass.     Tenderness: There is no abdominal tenderness. There is no rebound.  Musculoskeletal:        General: No tenderness or deformity. Normal range of motion.     Cervical back: Normal range of motion and neck supple.  Lymphadenopathy:     Cervical: No cervical adenopathy.  Skin:    General: Skin is warm.     Coloration: Skin is not pale.     Findings: No erythema or rash.  Neurological:     Mental Status: He is alert and oriented to person, place, and time.     Cranial Nerves: No cranial nerve deficit.     Motor: No abnormal muscle tone.     Coordination: Coordination normal.     Deep Tendon Reflexes: Reflexes are normal and symmetric.  Psychiatric:        Behavior: Behavior normal.        Thought Content: Thought content normal.        Judgment: Judgment normal.       Assessment & Plan:  Prostate cancer screening - Plan: PSA  Essential hypertension - Plan: CBC with Differential/Platelet, Comprehensive metabolic panel with GFR, Lipid panel  Annual physical  exam - Plan: CBC with Differential/Platelet, Comprehensive metabolic panel with GFR, Lipid panel, PSA  Pure hypercholesterolemia - Plan: CBC with Differential/Platelet, Comprehensive metabolic panel with GFR, Lipid panel, CT CARDIAC SCORING (SELF PAY ONLY)  Flu vaccine need - Plan: Flu vaccine HIGH DOSE PF(Fluzone Trivalent)  Hypothyroidism, unspecified type - Plan: TSH Patient received his flu shot today.  He also received the shingles vaccine.  Will check CBC CMP lipid panel.  Will check a PSA along with a TSH.  Patient would also like to schedule a coronary artery calcium score.  If elevated, he would consider taking a statin.  He promises he will check his blood pressure daily and provide me numbers in 1 week to determine if he needs medication to lower his blood pressure further.  Cologuard is due next year.  I will check a PSA to screen for prostate cancer.

## 2024-03-15 ENCOUNTER — Ambulatory Visit: Payer: Self-pay | Admitting: Family Medicine

## 2024-03-15 LAB — COMPREHENSIVE METABOLIC PANEL WITH GFR
AG Ratio: 1.8 (calc) (ref 1.0–2.5)
ALT: 14 U/L (ref 9–46)
AST: 18 U/L (ref 10–35)
Albumin: 4.2 g/dL (ref 3.6–5.1)
Alkaline phosphatase (APISO): 57 U/L (ref 35–144)
BUN: 25 mg/dL (ref 7–25)
CO2: 21 mmol/L (ref 20–32)
Calcium: 9.2 mg/dL (ref 8.6–10.3)
Chloride: 108 mmol/L (ref 98–110)
Creat: 1.1 mg/dL (ref 0.70–1.35)
Globulin: 2.4 g/dL (ref 1.9–3.7)
Glucose, Bld: 88 mg/dL (ref 65–99)
Potassium: 4.4 mmol/L (ref 3.5–5.3)
Sodium: 140 mmol/L (ref 135–146)
Total Bilirubin: 0.5 mg/dL (ref 0.2–1.2)
Total Protein: 6.6 g/dL (ref 6.1–8.1)
eGFR: 73 mL/min/1.73m2 (ref >=60)

## 2024-03-15 LAB — LIPID PANEL
Cholesterol: 182 mg/dL (ref <200)
HDL: 51 mg/dL (ref >=40)
LDL Cholesterol (Calc): 117 mg/dL — ABNORMAL HIGH
Non-HDL Cholesterol (Calc): 131 mg/dL — ABNORMAL HIGH (ref <130)
Total CHOL/HDL Ratio: 3.6 (calc) (ref <5.0)
Triglycerides: 51 mg/dL (ref <150)

## 2024-03-15 LAB — TSH: TSH: 0.87 m[IU]/L (ref 0.40–4.50)

## 2024-03-15 LAB — CBC WITH DIFFERENTIAL/PLATELET
Absolute Lymphocytes: 1050 {cells}/uL (ref 850–3900)
Absolute Monocytes: 510 {cells}/uL (ref 200–950)
Basophils Absolute: 100 {cells}/uL (ref 0–200)
Basophils Relative: 2 %
Eosinophils Absolute: 250 {cells}/uL (ref 15–500)
Eosinophils Relative: 5 %
HCT: 42.5 % (ref 38.5–50.0)
Hemoglobin: 14.1 g/dL (ref 13.2–17.1)
MCH: 31.3 pg (ref 27.0–33.0)
MCHC: 33.2 g/dL (ref 32.0–36.0)
MCV: 94.2 fL (ref 80.0–100.0)
MPV: 10.2 fL (ref 7.5–12.5)
Monocytes Relative: 10.2 %
Neutro Abs: 3090 {cells}/uL (ref 1500–7800)
Neutrophils Relative %: 61.8 %
Platelets: 262 Thousand/uL (ref 140–400)
RBC: 4.51 Million/uL (ref 4.20–5.80)
RDW: 11.7 % (ref 11.0–15.0)
Total Lymphocyte: 21 %
WBC: 5 Thousand/uL (ref 3.8–10.8)

## 2024-03-15 LAB — PSA: PSA: 0.52 ng/mL (ref <=4.00)

## 2024-03-20 ENCOUNTER — Telehealth: Payer: Self-pay

## 2024-03-20 NOTE — Telephone Encounter (Signed)
 Copied from CRM [id number]. Topic: Clinical - Medical Advice >> Mar 20, 2024  9:12 AM Larissa RAMAN wrote: Reason for CRM: Patient calling to inform PCP that his blood pressure is 155/90 on average with no additional symptoms.

## 2024-03-21 ENCOUNTER — Other Ambulatory Visit: Payer: Self-pay

## 2024-03-21 ENCOUNTER — Ambulatory Visit: Payer: Medicare HMO | Admitting: *Deleted

## 2024-03-21 VITALS — Ht 68.0 in | Wt 174.0 lb

## 2024-03-21 DIAGNOSIS — Z0000 Encounter for general adult medical examination without abnormal findings: Secondary | ICD-10-CM | POA: Diagnosis not present

## 2024-03-21 MED ORDER — LOSARTAN POTASSIUM 50 MG PO TABS: 50.0000 mg | ORAL_TABLET | Freq: Every day | ORAL | 1 refills | Status: AC

## 2024-03-21 NOTE — Patient Instructions (Signed)
 Mr. Edgar Miller , Thank you for taking time to come for your Medicare Wellness Visit. I appreciate your ongoing commitment to your health goals. Please review the following plan we discussed and let me know if I can assist you in the future.   Screening recommendations/referrals: Colonoscopy:  Recommended yearly ophthalmology/optometry visit for glaucoma screening and checkup Recommended yearly dental visit for hygiene and checkup  Vaccinations: Influenza vaccine:  Pneumococcal vaccine:  Tdap vaccine:  Shingles vaccine:      Preventive Care 65 Years and Older, Male Preventive care refers to lifestyle choices and visits with your health care provider that can promote health and wellness. What does preventive care include? A yearly physical exam. This is also called an annual well check. Dental exams once or twice a year. Routine eye exams. Ask your health care provider how often you should have your eyes checked. Personal lifestyle choices, including: Daily care of your teeth and gums. Regular physical activity. Eating a healthy diet. Avoiding tobacco and drug use. Limiting alcohol use. Practicing safe sex. Taking low doses of aspirin every day. Taking vitamin and mineral supplements as recommended by your health care provider. What happens during an annual well check? The services and screenings done by your health care provider during your annual well check will depend on your age, overall health, lifestyle risk factors, and family history of disease. Counseling  Your health care provider may ask you questions about your: Alcohol use. Tobacco use. Drug use. Emotional well-being. Home and relationship well-being. Sexual activity. Eating habits. History of falls. Memory and ability to understand (cognition). Work and work Astronomer. Screening  You may have the following tests or measurements: Height, weight, and BMI. Blood pressure. Lipid and cholesterol levels. These may  be checked every 5 years, or more frequently if you are over 15 years old. Skin check. Lung cancer screening. You may have this screening every year starting at age 74 if you have a 30-pack-year history of smoking and currently smoke or have quit within the past 15 years. Fecal occult blood test (FOBT) of the stool. You may have this test every year starting at age 67. Flexible sigmoidoscopy or colonoscopy. You may have a sigmoidoscopy every 5 years or a colonoscopy every 10 years starting at age 41. Prostate cancer screening. Recommendations will vary depending on your family history and other risks. Hepatitis C blood test. Hepatitis B blood test. Sexually transmitted disease (STD) testing. Diabetes screening. This is done by checking your blood sugar (glucose) after you have not eaten for a while (fasting). You may have this done every 1-3 years. Abdominal aortic aneurysm (AAA) screening. You may need this if you are a current or former smoker. Osteoporosis. You may be screened starting at age 80 if you are at high risk. Talk with your health care provider about your test results, treatment options, and if necessary, the need for more tests. Vaccines  Your health care provider may recommend certain vaccines, such as: Influenza vaccine. This is recommended every year. Tetanus, diphtheria, and acellular pertussis (Tdap, Td) vaccine. You may need a Td booster every 10 years. Zoster vaccine. You may need this after age 26. Pneumococcal 13-valent conjugate (PCV13) vaccine. One dose is recommended after age 74. Pneumococcal polysaccharide (PPSV23) vaccine. One dose is recommended after age 26. Talk to your health care provider about which screenings and vaccines you need and how often you need them. This information is not intended to replace advice given to you by your health care provider.  Make sure you discuss any questions you have with your health care provider. Document Released: 06/12/2015  Document Revised: 02/03/2016 Document Reviewed: 03/17/2015 Elsevier Interactive Patient Education  2017 [employer].  Fall Prevention in the Home Falls can cause injuries. They can happen to people of all ages. There are many things you can do to make your home safe and to help prevent falls. What can I do on the outside of my home? Regularly fix the edges of walkways and driveways and fix any cracks. Remove anything that might make you trip as you walk through a door, such as a raised step or threshold. Trim any bushes or trees on the path to your home. Use bright outdoor lighting. Clear any walking paths of anything that might make someone trip, such as rocks or tools. Regularly check to see if handrails are loose or broken. Make sure that both sides of any steps have handrails. Any raised decks and porches should have guardrails on the edges. Have any leaves, snow, or ice cleared regularly. Use sand or salt on walking paths during winter. Clean up any spills in your garage right away. This includes oil or grease spills. What can I do in the bathroom? Use night lights. Install grab bars by the toilet and in the tub and shower. Do not use towel bars as grab bars. Use non-skid mats or decals in the tub or shower. If you need to sit down in the shower, use a plastic, non-slip stool. Keep the floor dry. Clean up any water that spills on the floor as soon as it happens. Remove soap buildup in the tub or shower regularly. Attach bath mats securely with double-sided non-slip rug tape. Do not have throw rugs and other things on the floor that can make you trip. What can I do in the bedroom? Use night lights. Make sure that you have a light by your bed that is easy to reach. Do not use any sheets or blankets that are too big for your bed. They should not hang down onto the floor. Have a firm chair that has side arms. You can use this for support while you get dressed. Do not have throw rugs  and other things on the floor that can make you trip. What can I do in the kitchen? Clean up any spills right away. Avoid walking on wet floors. Keep items that you use a lot in easy-to-reach places. If you need to reach something above you, use a strong step stool that has a grab bar. Keep electrical cords out of the way. Do not use floor polish or wax that makes floors slippery. If you must use wax, use non-skid floor wax. Do not have throw rugs and other things on the floor that can make you trip. What can I do with my stairs? Do not leave any items on the stairs. Make sure that there are handrails on both sides of the stairs and use them. Fix handrails that are broken or loose. Make sure that handrails are as long as the stairways. Check any carpeting to make sure that it is firmly attached to the stairs. Fix any carpet that is loose or worn. Avoid having throw rugs at the top or bottom of the stairs. If you do have throw rugs, attach them to the floor with carpet tape. Make sure that you have a light switch at the top of the stairs and the bottom of the stairs. If you do not have them,  ask someone to add them for you. What else can I do to help prevent falls? Wear shoes that: Do not have high heels. Have rubber bottoms. Are comfortable and fit you well. Are closed at the toe. Do not wear sandals. If you use a stepladder: Make sure that it is fully opened. Do not climb a closed stepladder. Make sure that both sides of the stepladder are locked into place. Ask someone to hold it for you, if possible. Clearly mark and make sure that you can see: Any grab bars or handrails. First and last steps. Where the edge of each step is. Use tools that help you move around (mobility aids) if they are needed. These include: Canes. Walkers. Scooters. Crutches. Turn on the lights when you go into a dark area. Replace any light bulbs as soon as they burn out. Set up your furniture so you have a  clear path. Avoid moving your furniture around. If any of your floors are uneven, fix them. If there are any pets around you, be aware of where they are. Review your medicines with your doctor. Some medicines can make you feel dizzy. This can increase your chance of falling. Ask your doctor what other things that you can do to help prevent falls. This information is not intended to replace advice given to you by your health care provider. Make sure you discuss any questions you have with your health care provider. Document Released: 03/12/2009 Document Revised: 10/22/2015 Document Reviewed: 06/20/2014 Elsevier Interactive Patient Education  2017 [employer].

## 2024-03-21 NOTE — Progress Notes (Signed)
 Subjective:   Armarion Greek is a 68 y.o. male who presents for Medicare Annual/Subsequent preventive examination.  Visit Complete: Virtual I connected with  Nancyann Conn on 03/21/24 by a audio enabled telemedicine application and verified that I am speaking with the correct person using two identifiers.  Patient Location: Home  Provider Location: Home Office  I discussed the limitations of evaluation and management by telemedicine. The patient expressed understanding and agreed to proceed.  Vital Signs: Because this visit was a virtual/telehealth visit, some criteria may be missing or patient reported. Any vitals not documented were not able to be obtained and vitals that have been documented are patient reported.    Cardiac Risk Factors include: advanced age (>48men, >80 women);hypertension;male gender;obesity (BMI >30kg/m2)     Objective:    Today's Vitals   03/21/24 1149  Weight: 174 lb (78.9 kg)  Height: 5' 8 (1.727 m)   Body mass index is 26.46 kg/m.     03/21/2024   11:51 AM 03/16/2023    8:51 AM 03/06/2015    3:32 PM 09/01/2014    3:23 PM 08/15/2014   12:43 AM 08/14/2014    7:56 PM  Advanced Directives  Does Patient Have a Medical Advance Directive? Yes No Yes  No  No  No   Type of Estate agent of [employer] Power of [place];Living will      Copy of Healthcare Power of Attorney in Chart? No - copy requested       Would patient like information on creating a medical advance directive?  Yes (MAU/Ambulatory/Procedural Areas - Information given)   No - patient declined information  No - patient declined information      Data saved with a previous flowsheet row definition    Current Medications (verified) Outpatient Encounter Medications as of 03/21/2024  Medication Sig   levocetirizine (XYZAL ) 5 MG tablet TAKE 1 TABLET BY MOUTH EVERY DAY   levothyroxine  (SYNTHROID ) 125 MCG tablet TAKE 1 TABLET BY MOUTH EVERY DAY   losartan (COZAAR)  50 MG tablet Take 1 tablet (50 mg total) by mouth daily.   rivaroxaban  (XARELTO ) 20 MG TABS tablet Take 1 tablet (20 mg total) by mouth daily with supper.   rivaroxaban  (XARELTO ) 20 MG TABS tablet Take 1 tablet (20 mg total) by mouth daily with supper.   No facility-administered encounter medications on file as of 03/21/2024.    Allergies (verified) Codeine and Penicillins   History: Past Medical History:  Diagnosis Date   Allergic rhinitis    Allergy February   [employer]reesia 03/02/2020   Clotting disorder    [employer]rees[employer] 03/02/2020   Hypertension    Hypothyroidism    Pulmonary embolism (HCC)    Thyroid disease    [employer]reesia 03/02/2020   Past Surgical History:  Procedure Laterality Date   ANKLE SURGERY Right    Heart ablation     KNEE ARTHROSCOPY WITH MENISCAL REPAIR     THUMB ARTHROSCOPY Left    pins   Family History  Problem Relation Age of Onset   Stroke Mother    Hypothyroidism Mother    Heart failure Father    Atrial fibrillation Father    Intellectual disability Father    Prostate cancer Paternal Grandfather    Bladder Cancer Paternal Grandfather    Kidney disease Maternal Grandfather    Social History   Socioeconomic History   Marital status: Married    Spouse name: Not on file   Number of children: 5  Years of education: Not on file   Highest education level: Bachelor's degree (e.g., BA, AB, BS)  Occupational History   Occupation: chief Radiographer, therapeutic  Tobacco Use   Smoking status: Never   Smokeless tobacco: Never  Vaping Use   Vaping status: Never Used  Substance and Sexual Activity   Alcohol use: No   Drug use: No   Sexual activity: Yes    Birth control/protection: Post-menopausal  Other Topics Concern   Not on file  Social History Narrative   Not on file   Social Drivers of Health   Financial Resource Strain: Low Risk  (03/21/2024)   Overall Financial Resource Strain (CARDIA)    Difficulty of Paying Living Expenses: Not hard at all  Food  Insecurity: No Food Insecurity (03/21/2024)   Hunger Vital Sign    Worried About Running Out of Food in the Last Year: Never true    Ran Out of Food in the Last Year: Never true  Transportation Needs: No Transportation Needs (03/21/2024)   PRAPARE - Administrator, Civil Service (Medical): No    Lack of Transportation (Non-Medical): No  Physical Activity: Sufficiently Active (03/21/2024)   Exercise Vital Sign    Days of Exercise per Week: 5 days    Minutes of Exercise per Session: 60 min  Stress: No Stress Concern Present (03/21/2024)   [employer] of Occupational Health - Occupational Stress Questionnaire    Feeling of Stress: Not at all  Social Connections: Socially Integrated (03/21/2024)   Social Connection and Isolation Panel    Frequency of Communication with Friends and Family: More than three times a week    Frequency of Social Gatherings with Friends and Family: More than three times a week    Attends Religious Services: More than 4 times per year    Active Member of [employer] or Organizations: Yes    Attends Engineer, structural: More than 4 times per year    Marital Status: Married    Tobacco Counseling Counseling given: Not Answered   Clinical Intake:  Pre-visit preparation completed: Yes  Pain : No/denies pain        How often do you need to have someone help you when you read instructions, pamphlets, or other written materials from your doctor or pharmacy?: 1 - Never  Interpreter Needed?: No  Information entered by :: [email or web address]   Activities of Daily Living    03/21/2024   11:56 AM  In your present state of health, do you have any difficulty performing the following activities:  Hearing? 0  Vision? 0  Difficulty concentrating or making decisions? 0  Walking or climbing stairs? 0  Dressing or bathing? 0  Doing errands, shopping? 0  Preparing Food and eating ? N  Using the Toilet? N  In the past six months, have you accidently  leaked urine? N  Do you have problems with loss of bowel control? N  Managing your Medications? N  Managing your Finances? N  Housekeeping or managing your Housekeeping? N    Patient Care Team: Duanne Butler DASEN, MD as PCP - General (Family Medicine)  Indicate any recent Medical Services you may have received from other than [facility] providers in the past year (date may be approximate).     Assessment:   This is a routine wellness examination for Coda.  Hearing/Vision screen Hearing Screening - Comments:: No trouble hearing Vision Screening - Comments:: Up to date Looking for a new doctor   Goals Addressed  This Visit's Progress    Remain active and independent   On track    Weight (lb) < 200 lb (90.7 kg)   174 lb (78.9 kg)      Depression Screen    03/21/2024   11:50 AM 03/16/2023    8:50 AM 03/10/2023   12:35 PM 01/19/2023   10:03 AM 03/04/2022    8:36 AM 10/30/2020    7:58 AM 03/03/2020    8:30 AM  PHQ 2/9 Scores  PHQ - 2 Score 0 0 0 0 0 0 0  PHQ- 9 Score 0          Fall Risk    03/21/2024   11:57 AM 03/16/2023    8:51 AM 03/10/2023   12:35 PM 01/19/2023   10:03 AM 10/30/2020    7:58 AM  Fall Risk   Falls in the past year? 0 0 0 0 0  Number falls in past yr: 0 0 0 0 0  Injury with Fall? 0 0 0 0 0  Risk for fall due to :  No Fall Risks No Fall Risks No Fall Risks No Fall Risks  Follow up Falls evaluation completed;Education provided;Falls prevention discussed Falls prevention discussed;Education provided;Falls evaluation completed Falls prevention discussed Falls prevention discussed Falls evaluation completed      Data saved with a previous flowsheet row definition    MEDICARE RISK AT HOME: Medicare Risk at Home Any stairs in or around the home?: Yes If so, are there any without handrails?: No Home free of loose throw rugs in walkways, pet beds, electrical cords, etc?: Yes Adequate lighting in your home to reduce risk of falls?: Yes Life  alert?: No Use of a cane, walker or w/c?: No Grab bars in the bathroom?: Yes Shower chair or bench in shower?: No Elevated toilet seat or a handicapped toilet?: Yes  TIMED UP AND GO:  Was the test performed?  No    Cognitive Function:        03/21/2024   11:52 AM 03/16/2023    8:51 AM  6CIT Screen  What Year? 0 points 0 points  What month? 0 points 0 points  What time? 0 points 0 points  Count back from 20 0 points 0 points  Months in reverse 0 points 0 points  Repeat phrase 0 points 0 points  Total Score 0 points 0 points    Immunizations Immunization History  Administered Date(s) Administered   Fluad Trivalent(High Dose 65+) 03/10/2023   INFLUENZA, HIGH DOSE SEASONAL PF 03/14/2024   Influenza Split 03/30/2014, 02/28/2016   Influenza,inj,Quad PF,6+ Mos 04/28/2017, 03/03/2020   Moderna Sars-Covid-2 Vaccination 09/02/2019, 09/30/2019, 06/15/2020   PNEUMOCOCCAL CONJUGATE-20 03/04/2022   Pfizer(Comirnaty)Fall Seasonal Vaccine 12 years and older 04/14/2022   Pneumococcal Polysaccharide-23 10/30/2020   Tdap 08/02/2016   Zoster Recombinant(Shingrix) 03/14/2024   Zoster, Live 08/02/2016    TDAP status: Up to date  Flu Vaccine status: Up to date  Pneumococcal vaccine status: Up to date  Covid-19 vaccine status: Information provided on how to obtain vaccines.   Qualifies for Shingles Vaccine? Yes   Zostavax completed No   Shingrix Completed?: No.    Education has been provided regarding the importance of this vaccine. Patient has been advised to call insurance company to determine out of pocket expense if they have not yet received this vaccine. Advised may also receive vaccine at local pharmacy or Health Dept. Verbalized acceptance and understanding.  Screening Tests Health Maintenance  Topic Date Due   Colonoscopy  05/31/2015   COVID-19 Vaccine (5 - 2025-26 season) 03/30/2024 (Originally 01/29/2024)   Zoster Vaccines- Shingrix (2 of 2) 05/09/2024   Medicare Annual  Wellness (AWV)  03/21/2025   DTaP/Tdap/Td (2 - Td or Tdap) 08/03/2026   Pneumococcal Vaccine: 50+ Years  Completed   Influenza Vaccine  Completed   Meningococcal B Vaccine  Aged Out   Hepatitis C Screening  Discontinued    Health Maintenance  Health Maintenance Due  Topic Date Due   Colonoscopy  05/31/2015    Colorectal cancer screening: Type of screening: Cologuard. Completed 2023. Repeat every 3 years  Lung Cancer Screening: (Low Dose CT Chest recommended if Age 38-80 years, 20 pack-year currently smoking OR have quit w/in 15years.) does not qualify.   Lung Cancer Screening Referral:   Additional Screening:  Hepatitis C Screening  never done  Vision Screening: Recommended annual ophthalmology exams for early detection of glaucoma and other disorders of the eye. Is the patient up to date with their annual eye exam?  Yes  Who is the provider or what is the name of the office in which the patient attends annual eye exams? Looking for a new one If pt is not established with a provider, would they like to be referred to a provider to establish care? No .   Dental Screening: Recommended annual dental exams for proper oral hygiene    Community Resource Referral / Chronic Care Management: CRR required this visit?  No   CCM required this visit?  No     Plan:     I have personally reviewed and noted the following in the patient's chart:   Medical and social history Use of alcohol, tobacco or illicit drugs  Current medications and supplements including opioid prescriptions. Patient is not currently taking opioid prescriptions. Functional ability and status Nutritional status Physical activity Advanced directives List of other physicians Hospitalizations, surgeries, and ER visits in previous 12 months Vitals Screenings to include cognitive, depression, and falls Referrals and appointments  In addition, I have reviewed and discussed with patient certain preventive  protocols, quality metrics, and best practice recommendations. A written personalized care plan for preventive services as well as general preventive health recommendations were provided to patient.     Mliss Graff, LPN   [id number]   After Visit Summary: (MyChart) Due to this being a telephonic visit, the after visit summary with patients personalized plan was offered to patient via MyChart   Nurse Notes:

## 2024-03-29 ENCOUNTER — Ambulatory Visit (HOSPITAL_COMMUNITY)
Admission: RE | Admit: 2024-03-29 | Discharge: 2024-03-29 | Disposition: A | Discharge status: Home / Self Care | Payer: Self-pay | Source: Ambulatory Visit | Attending: Family Medicine | Admitting: Family Medicine

## 2024-03-29 DIAGNOSIS — E7800 Pure hypercholesterolemia, unspecified: Secondary | ICD-10-CM | POA: Insufficient documentation

## 2024-04-02 ENCOUNTER — Other Ambulatory Visit: Payer: Self-pay

## 2024-04-02 DIAGNOSIS — E7800 Pure hypercholesterolemia, unspecified: Secondary | ICD-10-CM

## 2024-04-02 DIAGNOSIS — R931 Abnormal findings on diagnostic imaging of heart and coronary circulation: Secondary | ICD-10-CM

## 2024-04-02 MED ORDER — ROSUVASTATIN CALCIUM 10 MG PO TABS: 10.0000 mg | ORAL_TABLET | Freq: Every day | ORAL | 3 refills | Status: AC

## 2025-03-17 ENCOUNTER — Encounter: Admitting: Family Medicine

## 2025-03-27 ENCOUNTER — Ambulatory Visit
